# Patient Record
Sex: Male | Born: 1980 | Race: Black or African American | Hispanic: No | Marital: Single | Smoking: Never smoker
Health system: Southern US, Community
[De-identification: ages and names within clinical notes are randomized; demographics above are authoritative.]

## PROBLEM LIST (undated history)

## (undated) HISTORY — PX: TONSILECTOMY, ADENOIDECTOMY, BILATERAL MYRINGOTOMY AND TUBES: SHX2538

---
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
## 2001-10-30 ENCOUNTER — Emergency Department (HOSPITAL_COMMUNITY): Admission: EM | Admit: 2001-10-30 | Discharge: 2001-10-30 | Payer: Self-pay | Admitting: Emergency Medicine

## 2008-07-22 ENCOUNTER — Emergency Department (HOSPITAL_COMMUNITY): Admission: EM | Admit: 2008-07-22 | Discharge: 2008-07-22 | Payer: Self-pay | Admitting: Family Medicine

## 2010-06-14 ENCOUNTER — Encounter: Payer: Self-pay | Admitting: Cardiovascular Disease

## 2010-06-22 ENCOUNTER — Ambulatory Visit (INDEPENDENT_AMBULATORY_CARE_PROVIDER_SITE_OTHER): Payer: 59 | Admitting: Cardiovascular Disease

## 2010-06-22 ENCOUNTER — Encounter: Payer: Self-pay | Admitting: Cardiovascular Disease

## 2010-06-22 DIAGNOSIS — R079 Chest pain, unspecified: Secondary | ICD-10-CM

## 2010-06-22 NOTE — Assessment & Plan Note (Addendum)
His chest pains are somewhat atypical.  He does have a  Family history of CAD.  Will schedule him for a stress echo.  I've encouraged him to make sure his cholesterol levels are controlled.  I'll see him on an as needed basis if the stress echo is normal.  Otherwise, I'll see him soon if the stress echo is abnormal.  I've asked him to try Motrin for his pleuritic cp.  He had a prolonged upper respiratory infection and he may have some pleurisy.  I dont think this is pericardial chest pain.

## 2010-06-22 NOTE — Progress Notes (Signed)
Nicolas Arias Date of Birth  21-Apr-1980 [facility] / [facility] [address]     [address] [place], [place]  [address] [phone]  Fax  [phone]  History of Present Illness:  Healthy, young male with occasional episodes of cp.  Not assoc. With any activities.  Last 2-10 minutes.  Nothing makes better or worse.  + pleuretic component.  No leg swelling. No syncope.   Current Outpatient Prescriptions on File Prior to Visit  Medication Sig Dispense Refill  . amoxicillin-clavulanate (AUGMENTIN) 875-125 MG per tablet Take 1 tablet by mouth 2 (two) times daily.        . Benzonatate (TESSALON PERLES PO) Take by mouth 3 (three) times daily.          No Known Allergies  Past Medical History  Diagnosis Date  . Chest pain     Past Surgical History  Procedure Date  . Tonsilectomy, adenoidectomy, bilateral myringotomy and tubes     History  Smoking status  . Never Smoker   Smokeless tobacco  . Not on file    History  Alcohol Use No    Family History  Problem Relation Age of Onset  . Arrhythmia Father     Reviw of Systems:  Reviewed in the HPI.  All other systems are negative.  Physical Exam: BP 98/70  Pulse 80  Wt 150 lb (68.04 kg) The patient is alert and oriented x 3.  The mood and affect are normal.  The skin is warm and dry.  Color is normal.  The HEENT exam reveals that the sclera are nonicteric.  The mucous membranes are moist.  The carotids are 2+ without bruits.  There is no thyromegaly.  There is no JVD.  The lungs are clear.  The chest wall is non tender.  The heart exam reveals a regular rate with a normal S1 and S2.  There are no murmurs, gallops, or rubs.  The PMI is not displaced.   Abdominal exam reveals good bowel sounds.  There is no guarding or rebound.  There is no hepatosplenomegaly or tenderness.  There are no masses.  Exam of the legs reveal no clubbing, cyanosis, or edema.  The legs are without rashes.  The distal  pulses are intact.  Cranial nerves II - XII are intact.  Motor and sensory functions are intact.  The gait is normal.  ECG: 3 lead rhythm strip reveals NSR with  no ST or T wave changes.    Assessment / Plan:

## 2010-07-11 ENCOUNTER — Other Ambulatory Visit (HOSPITAL_COMMUNITY): Payer: 59 | Admitting: Radiology

## 2010-07-23 ENCOUNTER — Telehealth: Payer: Self-pay | Admitting: Cardiovascular Disease

## 2010-07-23 NOTE — Telephone Encounter (Signed)
Fax: [phone] Latest OV

## 2010-08-07 ENCOUNTER — Telehealth: Payer: Self-pay | Admitting: *Deleted

## 2010-08-07 NOTE — Telephone Encounter (Signed)
Cant make echo app at this time due to insurance and finances per pt.

## 2011-12-18 ENCOUNTER — Encounter: Payer: Self-pay | Admitting: Family Medicine

## 2011-12-18 ENCOUNTER — Ambulatory Visit (INDEPENDENT_AMBULATORY_CARE_PROVIDER_SITE_OTHER): Payer: BC Managed Care – PPO | Admitting: Family Medicine

## 2011-12-18 VITALS — BP 98/70 | HR 77 | Temp 98.3°F | Ht 71.0 in | Wt 144.0 lb

## 2011-12-18 DIAGNOSIS — Z7689 Persons encountering health services in other specified circumstances: Secondary | ICD-10-CM

## 2011-12-18 DIAGNOSIS — Z13220 Encounter for screening for lipoid disorders: Secondary | ICD-10-CM

## 2011-12-18 DIAGNOSIS — Z131 Encounter for screening for diabetes mellitus: Secondary | ICD-10-CM

## 2011-12-18 DIAGNOSIS — Z0289 Encounter for other administrative examinations: Secondary | ICD-10-CM

## 2011-12-18 LAB — LIPID PANEL
HDL: 47.4 mg/dL (ref >39.00)
LDL Cholesterol: 99 mg/dL (ref 0–99)
Total CHOL/HDL Ratio: 3
VLDL: 9.4 mg/dL (ref 0.0–40.0)

## 2011-12-18 LAB — HEMOGLOBIN A1C: Hgb A1c MFr Bld: 5.8 % (ref 4.6–6.5)

## 2011-12-18 NOTE — Patient Instructions (Addendum)
-  We have ordered labs or studies at this visit. It can take up to 1-2 weeks for results and processing. We will contact you with instructions IF your results are abnormal. Normal results will be released to your MYCHART. If you have not heard from us or can not find your results in MYCHART in 2 weeks please contact our office.  -PLEASE SIGN UP FOR MYCHART TODAY   We recommend the following healthy lifestyle measures: - eat a healthy diet consisting of lots of vegetables, fruits, beans, nuts, seeds, healthy meats such as white chicken and fish and whole grains.  - avoid fried foods, fast food, processed foods, sodas, red meet and other fattening foods.  - get a least 150 minutes of aerobic exercise per week.   Follow up in: 1 year  

## 2011-12-18 NOTE — Progress Notes (Signed)
Chief Complaint  Patient presents with  . Establish Care    HPI: Nicolas Arias is here to establish care. 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 No concerns today. No chronic health problems. No regular exercise.  Diet is good. Just recently had a new baby - children are 8, 4 and 4 weeks. Had some atypical CP early this year and was going to get an echo, but symptoms resolved and has had none since and did not get this exam.  Has the following concerns today: None  Other Providers: None  Flu vaccine: refused, had tetanus booster 1-2 years ago.  ROS: See pertinent positives and negatives per HPI. Denies CP, SOB, palpitations, bowel changes, fevers, weigh changes, urinary symptoms, vision problems.  Past Medical History  Diagnosis Date  . Chest pain     Family History  Problem Relation Age of Onset  . Arrhythmia Father   . Heart disease Father     pacemaker, MI  . Hypertension      parent     History   Social History  . Marital Status: Single    Spouse Name: N/A    Number of Children: N/A  . Years of Education: N/A   Social History Main Topics  . Smoking status: Never Smoker   . Smokeless tobacco: None  . Alcohol Use: No  . Drug Use: None  . Sexually Active: None   Other Topics Concern  . None   Social History Narrative  . None    No current outpatient prescriptions on file.  EXAM:  Filed Vitals:   12/18/11 0929  BP: 98/70  Pulse: 77  Temp: 98.3 F (36.8 C)    Body mass index is 20.08 kg/(m^2).  GENERAL: vitals reviewed and listed above, alert, oriented, appears well hydrated and in no acute distress  HEENT: atraumatic, conjunttiva clear, no obvious abnormalities on inspection of external nose and ears  NECK: no obvious masses on inspection  LUNGS: clear to auscultation bilaterally, no wheezes, rales or rhonchi, good air movement  CV: HRRR, no peripheral edema  MS: moves all extremities without noticeable  abnormality  PSYCH: pleasant and cooperative, no obvious depression or anxiety  ASSESSMENT AND PLAN:  Discussed the following assessment and plan:  1. Encounter to establish care with new doctor    2. Diabetes mellitus screening  Hemoglobin A1c  3. Screening for hypercholesterolemia  Lipid Panel   -did eat today - but wants to go ahead and do labs -discussed level A and B USPSTF recommendations -father with heart disease - he does not know age - discussed importance of healthy diet and regular exercise -We reviewed the PMH, PSH, FH, SH, Meds and Allergies. -We provided refills for any medications we will prescribe as needed. -We addressed current concerns per orders and patient instructions. -We have asked for records for pertinent exams, studies, vaccines and notes from previous providers. -We have advised patient to follow up per instructions below. -Influenza vaccine refused  -Patient advised to return or notify a doctor immediately if symptoms worsen or persist or new concerns arise.  Patient Instructions  -We have ordered labs or studies at this visit. It can take up to 1-2 weeks for results and processing. We will contact you with instructions IF your results are abnormal. Normal results will be released to your [facility]. If you have not heard from [place] or can not find your results in [facility] in 2 weeks please contact our office.  -PLEASE  SIGN UP FOR MYCHART TODAY   We recommend the following healthy lifestyle measures: - eat a healthy diet consisting of lots of vegetables, fruits, beans, nuts, seeds, healthy meats such as white chicken and fish and whole grains.  - avoid fried foods, fast food, processed foods, sodas, red meet and other fattening foods.  - get a least 150 minutes of aerobic exercise per week.   Follow up in: 1 year      Aneli Zara, Damita Lack.

## 2011-12-19 ENCOUNTER — Telehealth: Payer: Self-pay | Admitting: Family Medicine

## 2011-12-19 NOTE — Telephone Encounter (Signed)
Left a message for pt to return call 

## 2011-12-19 NOTE — Telephone Encounter (Signed)
Called and spoke with pt and pt is aware.  

## 2011-12-19 NOTE — Telephone Encounter (Signed)
Please let patient know, since not yet signed up for mychart, wanted to contact regarding lab results. Recommend signing up for mychart to see these results in about 10 days.  -cholesterol is good -diabetes screening lab is a little high (>5.6) indicating a risk for developing diabetes  The best treatment to hopefully reverse these findings and prevent adverse health outcomes is a healthy diet and regular exercise.  Schedule follow up in about 4-6 months if not already scheduled to do so.

## 2012-05-25 ENCOUNTER — Emergency Department (HOSPITAL_COMMUNITY)
Admission: EM | Admit: 2012-05-25 | Discharge: 2012-05-26 | Disposition: A | Discharge status: Home / Self Care | Payer: BC Managed Care – PPO | Attending: Emergency Medicine | Admitting: Emergency Medicine

## 2012-05-25 ENCOUNTER — Encounter (HOSPITAL_COMMUNITY): Payer: Self-pay | Admitting: *Deleted

## 2012-05-25 DIAGNOSIS — R059 Cough, unspecified: Secondary | ICD-10-CM | POA: Insufficient documentation

## 2012-05-25 DIAGNOSIS — IMO0001 Reserved for inherently not codable concepts without codable children: Secondary | ICD-10-CM | POA: Insufficient documentation

## 2012-05-25 DIAGNOSIS — Z9089 Acquired absence of other organs: Secondary | ICD-10-CM | POA: Insufficient documentation

## 2012-05-25 DIAGNOSIS — R079 Chest pain, unspecified: Secondary | ICD-10-CM | POA: Insufficient documentation

## 2012-05-25 DIAGNOSIS — R509 Fever, unspecified: Secondary | ICD-10-CM | POA: Insufficient documentation

## 2012-05-25 DIAGNOSIS — R1110 Vomiting, unspecified: Secondary | ICD-10-CM | POA: Insufficient documentation

## 2012-05-25 DIAGNOSIS — R05 Cough: Secondary | ICD-10-CM | POA: Insufficient documentation

## 2012-05-25 NOTE — ED Notes (Signed)
Pt states that he began having nausea/ vomiting, fever and generalized body aches, chest pain, cough and urinary pain that began early this morning; pt was seen at an Urgent Care on [address] around 3pm and was prescribed Levaquin; pt states that he does not know what the anbx are for or what he was diagnosed with. Pt states that he is concerned over the chest pain and cough and that he has a fever. Pt states that there was no blood work or Xrays done at the Urgent Care today.

## 2012-05-26 ENCOUNTER — Emergency Department (HOSPITAL_COMMUNITY): Payer: BC Managed Care – PPO

## 2012-05-26 LAB — CBC WITH DIFFERENTIAL/PLATELET
Basophils Absolute: 0 10*3/uL (ref 0.0–0.1)
Basophils Relative: 0 % (ref 0–1)
Eosinophils Absolute: 0 10*3/uL (ref 0.0–0.7)
MCH: 31.6 pg (ref 26.0–34.0)
MCHC: 33.3 g/dL (ref 30.0–36.0)
Neutro Abs: 3.7 10*3/uL (ref 1.7–7.7)
Neutrophils Relative %: 74 % (ref 43–77)
Platelets: 155 10*3/uL (ref 150–400)
RBC: 4.24 MIL/uL (ref 4.22–5.81)

## 2012-05-26 LAB — COMPREHENSIVE METABOLIC PANEL
ALT: 16 U/L (ref 0–53)
AST: 20 U/L (ref 0–37)
Albumin: 4 g/dL (ref 3.5–5.2)
Alkaline Phosphatase: 61 U/L (ref 39–117)
Chloride: 97 mEq/L (ref 96–112)
Potassium: 4 mEq/L (ref 3.5–5.1)
Sodium: 133 mEq/L — ABNORMAL LOW (ref 135–145)
Total Protein: 6.9 g/dL (ref 6.0–8.3)

## 2012-05-26 LAB — URINALYSIS, ROUTINE W REFLEX MICROSCOPIC
Bilirubin Urine: NEGATIVE
Hgb urine dipstick: NEGATIVE
Specific Gravity, Urine: 1.006 (ref 1.005–1.030)
pH: 6.5 (ref 5.0–8.0)

## 2012-05-26 NOTE — ED Notes (Signed)
Pt given 1 cup of water with no complaints. Pt is currently sleeping.

## 2012-05-26 NOTE — ED Provider Notes (Signed)
History     CSN: [id number] Arrival date & time 05/25/12  2242 First MD Initiated Contact with Patient 05/26/12 [phone]      Chief Complaint  Patient presents with  . Emesis  . Fever    HPI Pt started having body aches an chest pain associated with cough today.  He went to an urgent care but states he did not really get checked out.  He was given a prescription for Levaquin. His symptoms persisted throughout the day so he came to the ED.  He vomited once while waiting to be evaluated but otherwise has been doing better. He has noticed that it hurts when he urinates.  Past Medical History  Diagnosis Date  . Chest pain     Past Surgical History  Procedure Laterality Date  . Tonsilectomy, adenoidectomy, bilateral myringotomy and tubes      Family History  Problem Relation Age of Onset  . Arrhythmia Father   . Heart disease Father     pacemaker, MI  . Hypertension      parent     History  Substance Use Topics  . Smoking status: Never Smoker   . Smokeless tobacco: Not on file  . Alcohol Use: No      Review of Systems  All other systems reviewed and are negative.    Allergies  Review of patient's allergies indicates no known allergies.  Home Medications   Current Outpatient Rx  Name  Route  Sig  Dispense  Refill  . levofloxacin (LEVAQUIN) 500 MG tablet   Oral   Take 500 mg by mouth daily. 10 day course           BP 104/68  Pulse 100  Temp(Src) 100.1 F (37.8 C) (Oral)  Resp 20  Ht 5\' 11"  (1.803 m)  Wt 150 lb 6.4 oz (68.221 kg)  BMI 20.99 kg/m2  SpO2 96%  Physical Exam  Nursing note and vitals reviewed. Constitutional: He appears well-developed and well-nourished. No distress.  HENT:  Head: Normocephalic and atraumatic.  Right Ear: External ear normal.  Left Ear: External ear normal.  Eyes: Conjunctivae are normal. Right eye exhibits no discharge. Left eye exhibits no discharge. No scleral icterus.  Neck: Neck supple. No tracheal deviation  present. No thyromegaly present.  Cardiovascular: Normal rate, regular rhythm and intact distal pulses.   Pulmonary/Chest: Effort normal and breath sounds normal. No stridor. No respiratory distress. He has no wheezes. He has no rales.  Abdominal: Soft. Bowel sounds are normal. He exhibits no distension. There is no tenderness. There is no rebound and no guarding.  Musculoskeletal: He exhibits no edema and no tenderness.  Lymphadenopathy:    He has no cervical adenopathy.  Neurological: He is alert. He has normal strength. No sensory deficit. Cranial nerve deficit:  no gross defecits noted. He exhibits normal muscle tone. He displays no seizure activity. Coordination normal.  Skin: Skin is warm and dry. No rash noted.  Psychiatric: He has a normal mood and affect.    ED Course  Procedures (including critical care time) EKG Normal sinus rhythm rate 96 Rightward axis normal intervals Early repolarization otherwise normal ST T waves No prior EKG for comparison Labs Reviewed  CBC WITH DIFFERENTIAL - Abnormal; Notable for the following:    Lymphocytes Relative 7 (*)    Lymphs Abs 0.4 (*)    Monocytes Relative 18 (*)    All other components within normal limits  COMPREHENSIVE METABOLIC PANEL - Abnormal; Notable for the following:  Sodium 133 (*)    Glucose, Bld 108 (*)    GFR calc non Af Amer 79 (*)    All other components within normal limits  LIPASE, BLOOD  URINALYSIS, ROUTINE W REFLEX MICROSCOPIC  POCT I-STAT TROPONIN I   Dg Chest 1 View  05/26/2012  *RADIOLOGY REPORT*  Clinical Data: Fever, nausea and chest pain.  CHEST - 1 VIEW  Comparison: None.  Findings: The lungs are well-aerated and clear.  There is no evidence of focal opacification, pleural effusion or pneumothorax.  The cardiomediastinal silhouette is within normal limits.  No acute osseous abnormalities are seen.  IMPRESSION: No acute cardiopulmonary process seen.   Original Report Authenticated By: Tonia Ghent, M.D.       1. Febrile illness       MDM  Patient's exam is benign. His laboratory tests are unremarkable. Is possible the symptoms may be related to a viral infection. Patient will be discharged home. He should take Tylenol ibuprofen. He should followup with her primary Dr. if not getting better within the week.        Celene Kras, MD 05/26/12 [phone]

## 2012-06-15 ENCOUNTER — Telehealth: Payer: Self-pay | Admitting: Family Medicine

## 2012-06-15 NOTE — Telephone Encounter (Signed)
Ok with me if Dr. Caryl Never taking new patients.

## 2012-06-15 NOTE — Telephone Encounter (Signed)
Message copied by Terressa Koyanagi on Mon Jun 15, 2012  1:30 PM ------      Message from: Primus Bravo      Created: Mon Jun 15, 2012  1:16 PM      Regarding: Change PCP      Contact: [phone]       Pt would like to change his PCP from Dr. Selena Batten, to the care of Dr. Caryl Never. Please assist.  ------

## 2012-06-17 ENCOUNTER — Ambulatory Visit: Payer: BC Managed Care – PPO | Admitting: Family Medicine

## 2012-06-24 ENCOUNTER — Ambulatory Visit: Payer: BC Managed Care – PPO | Admitting: Family Medicine

## 2012-10-15 ENCOUNTER — Ambulatory Visit: Payer: BC Managed Care – PPO | Admitting: Family Medicine

## 2012-11-19 ENCOUNTER — Encounter: Payer: Self-pay | Admitting: Family Medicine

## 2012-11-19 ENCOUNTER — Ambulatory Visit (INDEPENDENT_AMBULATORY_CARE_PROVIDER_SITE_OTHER): Payer: BC Managed Care – PPO | Admitting: Family Medicine

## 2012-11-19 VITALS — BP 112/62 | HR 70 | Temp 97.8°F | Wt 156.0 lb

## 2012-11-19 DIAGNOSIS — Z0000 Encounter for general adult medical examination without abnormal findings: Secondary | ICD-10-CM

## 2012-11-19 LAB — BASIC METABOLIC PANEL
BUN: 12 mg/dL (ref 6–23)
CO2: 29 mEq/L (ref 19–32)
Chloride: 105 mEq/L (ref 96–112)
Glucose, Bld: 95 mg/dL (ref 70–99)
Potassium: 4.5 mEq/L (ref 3.5–5.1)

## 2012-11-19 LAB — HEPATIC FUNCTION PANEL
ALT: 13 U/L (ref 0–53)
Bilirubin, Direct: 0.1 mg/dL (ref 0.0–0.3)
Total Bilirubin: 1.2 mg/dL (ref 0.3–1.2)

## 2012-11-19 LAB — CBC WITH DIFFERENTIAL/PLATELET
Basophils Relative: 0.5 % (ref 0.0–3.0)
Eosinophils Absolute: 0.1 10*3/uL (ref 0.0–0.7)
Lymphocytes Relative: 43.4 % (ref 12.0–46.0)
MCHC: 34 g/dL (ref 30.0–36.0)
Monocytes Relative: 7.9 % (ref 3.0–12.0)
Neutrophils Relative %: 46.5 % (ref 43.0–77.0)
RBC: 4.41 Mil/uL (ref 4.22–5.81)
WBC: 4.8 10*3/uL (ref 4.5–10.5)

## 2012-11-19 LAB — LIPID PANEL
Cholesterol: 161 mg/dL (ref 0–200)
LDL Cholesterol: 92 mg/dL (ref 0–99)
Total CHOL/HDL Ratio: 3
VLDL: 9.2 mg/dL (ref 0.0–40.0)

## 2012-11-19 LAB — POCT URINALYSIS DIPSTICK
Glucose, UA: NEGATIVE
Ketones, UA: NEGATIVE
Protein, UA: NEGATIVE
Spec Grav, UA: 1.02

## 2012-11-19 NOTE — Progress Notes (Signed)
  Subjective:    Patient ID: Nicolas Arias, male    DOB: 1980-06-11, 32 y.o.   MRN: [id number]  HPI Patient seen for complete physical Generally very healthy. No chronic medical problems. Takes no medications. No known drug allergies. Previous surgeries for tonsillectomy and myringotomy tubes.  Patient nonsmoker. He is married has 3 children. No regular alcohol use. No consistent exercise. No specific complaints today. Had some atypical chest pains over year ago was felt related to GERD. Had none since then.  Past Medical History  Diagnosis Date  . Chest pain    Past Surgical History  Procedure Laterality Date  . Tonsilectomy, adenoidectomy, bilateral myringotomy and tubes      reports that he has never smoked. He does not have any smokeless tobacco history on file. He reports that he does not drink alcohol or use illicit drugs. family history includes Arrhythmia in his father; Heart disease in his father; Hypertension in an other family member. No Known Allergies    Review of Systems  Constitutional: Negative for fever, activity change, appetite change and fatigue.  HENT: Negative for congestion, ear pain and trouble swallowing.   Eyes: Negative for pain and visual disturbance.  Respiratory: Negative for cough, shortness of breath and wheezing.   Cardiovascular: Negative for chest pain and palpitations.  Gastrointestinal: Negative for nausea, vomiting, abdominal pain, diarrhea, constipation, blood in stool, abdominal distention and rectal pain.  Endocrine: Negative for polydipsia and polyuria.  Genitourinary: Negative for dysuria, hematuria and testicular pain.  Musculoskeletal: Negative for arthralgias and joint swelling.  Skin: Negative for rash.  Neurological: Negative for dizziness, syncope and headaches.  Hematological: Negative for adenopathy.  Psychiatric/Behavioral: Negative for confusion and dysphoric mood.       Objective:   Physical Exam  Constitutional: He  is oriented to person, place, and time. He appears well-developed and well-nourished. No distress.  HENT:  Head: Normocephalic and atraumatic.  Right Ear: External ear normal.  Left Ear: External ear normal.  Mouth/Throat: Oropharynx is clear and moist.  Eyes: Conjunctivae and EOM are normal. Pupils are equal, round, and reactive to light.  Neck: Normal range of motion. Neck supple. No thyromegaly present.  Cardiovascular: Normal rate, regular rhythm and normal heart sounds.   No murmur heard. Pulmonary/Chest: No respiratory distress. He has no wheezes. He has no rales.  Abdominal: Soft. Bowel sounds are normal. He exhibits no distension and no mass. There is no tenderness. There is no rebound and no guarding.  Musculoskeletal: He exhibits no edema.  Lymphadenopathy:    He has no cervical adenopathy.  Neurological: He is alert and oriented to person, place, and time. He displays normal reflexes. No cranial nerve deficit.  Skin: No rash noted.  Psychiatric: He has a normal mood and affect.          Assessment & Plan:  Complete physical. Obtain screening lab work. Patient already had flu vaccine through work. Tetanus up-to-date. Discussed importance of regular physical exercise.

## 2013-12-17 ENCOUNTER — Encounter: Payer: BC Managed Care – PPO | Admitting: Urology

## 2014-04-13 ENCOUNTER — Other Ambulatory Visit (INDEPENDENT_AMBULATORY_CARE_PROVIDER_SITE_OTHER): Payer: BLUE CROSS/BLUE SHIELD

## 2014-04-13 DIAGNOSIS — Z0000 Encounter for general adult medical examination without abnormal findings: Secondary | ICD-10-CM

## 2014-04-13 LAB — LIPID PANEL
CHOLESTEROL: 147 mg/dL (ref 0–200)
HDL: 52.2 mg/dL (ref >39.00)
LDL Cholesterol: 86 mg/dL (ref 0–99)
NonHDL: 94.8
TRIGLYCERIDES: 42 mg/dL (ref 0.0–149.0)
Total CHOL/HDL Ratio: 3
VLDL: 8.4 mg/dL (ref 0.0–40.0)

## 2014-04-13 LAB — CBC WITH DIFFERENTIAL/PLATELET
BASOS ABS: 0 10*3/uL (ref 0.0–0.1)
BASOS PCT: 0.6 % (ref 0.0–3.0)
EOS PCT: 0.8 % (ref 0.0–5.0)
Eosinophils Absolute: 0 10*3/uL (ref 0.0–0.7)
HCT: 40.7 % (ref 39.0–52.0)
HEMOGLOBIN: 13.7 g/dL (ref 13.0–17.0)
LYMPHS PCT: 48.1 % — AB (ref 12.0–46.0)
Lymphs Abs: 2.3 10*3/uL (ref 0.7–4.0)
MCHC: 33.6 g/dL (ref 30.0–36.0)
MCV: 93.1 fl (ref 78.0–100.0)
MONOS PCT: 7.4 % (ref 3.0–12.0)
Monocytes Absolute: 0.4 10*3/uL (ref 0.1–1.0)
NEUTROS ABS: 2 10*3/uL (ref 1.4–7.7)
Neutrophils Relative %: 43.1 % (ref 43.0–77.0)
Platelets: 166 10*3/uL (ref 150.0–400.0)
RBC: 4.37 Mil/uL (ref 4.22–5.81)
RDW: 12.6 % (ref 11.5–15.5)
WBC: 4.7 10*3/uL (ref 4.0–10.5)

## 2014-04-13 LAB — HEPATIC FUNCTION PANEL
ALBUMIN: 4.4 g/dL (ref 3.5–5.2)
ALK PHOS: 56 U/L (ref 39–117)
ALT: 9 U/L (ref 0–53)
AST: 13 U/L (ref 0–37)
Bilirubin, Direct: 0.2 mg/dL (ref 0.0–0.3)
TOTAL PROTEIN: 6.8 g/dL (ref 6.0–8.3)
Total Bilirubin: 1 mg/dL (ref 0.2–1.2)

## 2014-04-13 LAB — BASIC METABOLIC PANEL
BUN: 11 mg/dL (ref 6–23)
CALCIUM: 9.3 mg/dL (ref 8.4–10.5)
CO2: 29 mEq/L (ref 19–32)
Chloride: 105 mEq/L (ref 96–112)
Creatinine, Ser: 1.19 mg/dL (ref 0.40–1.50)
GFR: 90.69 mL/min (ref >60.00)
GLUCOSE: 92 mg/dL (ref 70–99)
POTASSIUM: 4 meq/L (ref 3.5–5.1)
Sodium: 138 mEq/L (ref 135–145)

## 2014-04-13 LAB — TSH: TSH: 1.05 u[IU]/mL (ref 0.35–4.50)

## 2014-04-19 ENCOUNTER — Ambulatory Visit (INDEPENDENT_AMBULATORY_CARE_PROVIDER_SITE_OTHER): Payer: BLUE CROSS/BLUE SHIELD | Admitting: Family Medicine

## 2014-04-19 ENCOUNTER — Encounter: Payer: Self-pay | Admitting: Family Medicine

## 2014-04-19 VITALS — BP 120/80 | HR 78 | Temp 98.1°F | Ht 71.0 in | Wt 153.0 lb

## 2014-04-19 DIAGNOSIS — Z0000 Encounter for general adult medical examination without abnormal findings: Secondary | ICD-10-CM

## 2014-04-19 NOTE — Progress Notes (Signed)
   Subjective:    Patient ID: Nicolas Arias, male    DOB: 01/09/1981, 34 y.o.   MRN: [id number]  HPI Patient seen for complete physical. He has no chronic medical problems. He unfortunately was just laid off from work and will finish his current position in one month. 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 No history of smoking. Tetanus up-to-date. He declined flu vaccine this year. Somewhat inconsistent exercise.  Past Medical History  Diagnosis Date  . Chest pain    Past Surgical History  Procedure Laterality Date  . Tonsilectomy, adenoidectomy, bilateral myringotomy and tubes      reports that he has never smoked. He does not have any smokeless tobacco history on file. He reports that he does not drink alcohol or use illicit drugs. family history includes Arrhythmia in his father; Heart disease in his father; Hypertension in an other family member. No Known Allergies'    Review of Systems  Constitutional: Negative for fever, activity change, appetite change and fatigue.  HENT: Negative for congestion, ear pain and trouble swallowing.   Eyes: Negative for pain and visual disturbance.  Respiratory: Negative for cough, shortness of breath and wheezing.   Cardiovascular: Negative for chest pain and palpitations.  Gastrointestinal: Negative for nausea, vomiting, abdominal pain, diarrhea, constipation, blood in stool, abdominal distention and rectal pain.  Genitourinary: Negative for dysuria, hematuria and testicular pain.  Musculoskeletal: Negative for joint swelling and arthralgias.  Skin: Negative for rash.  Neurological: Negative for dizziness, syncope and headaches.  Hematological: Negative for adenopathy.  Psychiatric/Behavioral: Negative for confusion and dysphoric mood.       Objective:   Physical Exam  Constitutional: He is oriented to person, place, and time. He appears well-developed and well-nourished. No distress.  HENT:  Head:  Normocephalic and atraumatic.  Right Ear: External ear normal.  Left Ear: External ear normal.  Mouth/Throat: Oropharynx is clear and moist.  Eyes: Conjunctivae and EOM are normal. Pupils are equal, round, and reactive to light.  Neck: Normal range of motion. Neck supple. No thyromegaly present.  Solitary approximate 1/2 cm mobile rubbery consistency oval-shaped lymph node right anterior cervical triangle. No other adenopathy noted  Cardiovascular: Normal rate, regular rhythm and normal heart sounds.   No murmur heard. Pulmonary/Chest: No respiratory distress. He has no wheezes. He has no rales.  Abdominal: Soft. Bowel sounds are normal. He exhibits no distension and no mass. There is no tenderness. There is no rebound and no guarding.  Musculoskeletal: He exhibits no edema.  Neurological: He is alert and oriented to person, place, and time. He displays normal reflexes. No cranial nerve deficit.  Skin: No rash noted.  Psychiatric: He has a normal mood and affect.          Assessment & Plan:  Complete physical. Labs reviewed. No major concerns. Establish more consistent exercise habits. Tetanus up-to-date.

## 2014-04-19 NOTE — Progress Notes (Signed)
Pre visit review using our clinic review tool, if applicable. No additional management support is needed unless otherwise documented below in the visit note. 

## 2015-02-09 ENCOUNTER — Encounter (HOSPITAL_COMMUNITY): Payer: Self-pay | Admitting: Emergency Medicine

## 2015-02-09 ENCOUNTER — Emergency Department (HOSPITAL_COMMUNITY)
Admission: EM | Admit: 2015-02-09 | Discharge: 2015-02-09 | Disposition: A | Discharge status: Home / Self Care | Payer: Medicaid Other | Source: Home / Self Care

## 2015-02-09 DIAGNOSIS — R079 Chest pain, unspecified: Secondary | ICD-10-CM | POA: Diagnosis not present

## 2015-02-09 NOTE — ED Notes (Signed)
[facility] a [facility] chest pressure x 2 weeks. The patient denied any triggers for the chest pain. He described the pain as a pressure. The patient denied any cardiac issues.

## 2015-02-09 NOTE — Discharge Instructions (Signed)
Aspirin and Your Heart  Aspirin is a medicine that affects the way blood clots. Aspirin can be used to help reduce the risk of blood clots, heart attacks, and other heart-related problems.  SHOULD I TAKE ASPIRIN? Your health care provider will help you determine whether it is safe and beneficial for you to take aspirin daily. Taking aspirin daily may be beneficial if you:  Have had a heart attack or chest pain.  Have undergone open heart surgery such as coronary artery bypass surgery (CABG).  Have had coronary angioplasty.  Have experienced a stroke or transient ischemic attack (TIA).  Have peripheral vascular disease (PVD).  Have chronic heart rhythm problems such as atrial fibrillation. ARE THERE ANY RISKS OF TAKING ASPIRIN DAILY? Daily use of aspirin can increase your risk of side effects. Some of these include:  Bleeding. Bleeding problems can be minor or serious. An example of a minor problem is a cut that does not stop bleeding. An example of a more serious problem is stomach bleeding or bleeding into the brain. Your risk of bleeding is increased if you are also taking non-steroidal anti-inflammatory medicine (NSAIDs).  Increased bruising.  Upset stomach.  An allergic reaction. People who have nasal polyps have an increased risk of developing an aspirin allergy. WHAT ARE SOME GUIDELINES I SHOULD FOLLOW WHEN TAKING ASPIRIN?   Take aspirin only as directed by your health care provider. Make sure you understand how much you should take and what form you should take. The two forms of aspirin are:  Non-enteric-coated. This type of aspirin does not have a coating and is absorbed quickly. Non-enteric-coated aspirin is usually recommended for people with chest pain. This type of aspirin also comes in a chewable form.  Enteric-coated. This type of aspirin has a special coating that releases the medicine very slowly. Enteric-coated aspirin causes less stomach upset than non-enteric-coated  aspirin. This type of aspirin should not be chewed or crushed.  Drink alcohol in moderation. Drinking alcohol increases your risk of bleeding. WHEN SHOULD I SEEK MEDICAL CARE?   You have unusual bleeding or bruising.  You have stomach pain.  You have an allergic reaction. Symptoms of an allergic reaction include:  Hives.  Itchy skin.  Swelling of the lips, tongue, or face.  You have ringing in your ears. WHEN SHOULD I SEEK IMMEDIATE MEDICAL CARE?   Your bowel movements are bloody, dark red, or black in color.  You vomit or cough up blood.  You have blood in your urine.  You cough, wheeze, or feel short of breath. If you have any of the following symptoms, this is an emergency. Do not wait to see if the pain will go away. Get medical help at once. Call your local emergency services (911 in the [place] Do not drive yourself to the hospital.  You have severe chest pain, especially if the pain is crushing or pressure-like and spreads to the arms, back, neck, or jaw.  You have stroke-like symptoms, such as:   Loss of vision.   Difficulty talking.   Numbness or weakness on one side of your body.   Numbness or weakness in your arm or leg.   Not thinking clearly or feeling confused.    This information is not intended to replace advice given to you by your health care provider. Make sure you discuss any questions you have with your health care provider.   Document Released: 01/11/2008 Document Revised: 02/18/2014 Document Reviewed: 05/05/2013 Elsevier Interactive Patient Education 2016 [employer]   Inc.  

## 2015-02-10 NOTE — ED Provider Notes (Signed)
CSN: [id number]     Arrival date & time 02/09/15  1820 History   None    Chief Complaint  Patient presents with  . Chest Pain   (Consider location/radiation/quality/duration/timing/severity/associated sxs/prior Treatment) HPI History obtained from patient:   LOCATION:chest SEVERITY:1 DURATION:several weeks CONTEXT:episodic pain no cause, comes and goes, not related to activity or work QUALITY: MODIFYING FACTORS:none ASSOCIATED SYMPTOMS:none TIMING:episodic OCCUPATION:investor  Past Medical History  Diagnosis Date  . Chest pain    Past Surgical History  Procedure Laterality Date  . Tonsilectomy, adenoidectomy, bilateral myringotomy and tubes     Family History  Problem Relation Age of Onset  . Arrhythmia Father   . Heart disease Father     pacemaker, MI  . Hypertension      parent    Social History  Substance Use Topics  . Smoking status: Never Smoker   . Smokeless tobacco: None  . Alcohol Use: No    Review of Systems ROS +'ve chest pain  Denies: HEADACHE, NAUSEA, ABDOMINAL PAIN,  CONGESTION, DYSURIA, SHORTNESS OF BREATH  Allergies  Review of patient's allergies indicates no known allergies.  Home Medications   Prior to Admission medications   Not on File   Meds Ordered and Administered this Visit  Medications - No data to display  BP 127/80 mmHg  Pulse 77  Temp(Src) 98.2 F (36.8 C) (Oral)  Resp 18  SpO2 99% No data found.   Physical Exam  Constitutional: He is oriented to person, place, and time. He appears well-developed and well-nourished.  HENT:  Head: Normocephalic and atraumatic.  Eyes: Conjunctivae are normal.  Neck: Normal range of motion. Neck supple.  Cardiovascular: Normal rate, regular rhythm and normal heart sounds.   Pulmonary/Chest: Effort normal and breath sounds normal. He exhibits no tenderness.  Abdominal: Soft. Bowel sounds are normal.  Musculoskeletal: Normal range of motion.  Neurological: He is alert and oriented  to person, place, and time.  Skin: Skin is warm and dry.  Psychiatric: He has a normal mood and affect. His behavior is normal. Judgment and thought content normal.  Nursing note and vitals reviewed.   ED Course  Procedures (including critical care time)  Labs Review Labs Reviewed - No data to display  Imaging Review No results found.   Visual Acuity Review  Right Eye Distance:   Left Eye Distance:   Bilateral Distance:    Right Eye Near:   Left Eye Near:    Bilateral Near:         MDM   1. Chest pain, unspecified chest pain type    Chest pain is non specific and ECG done in UC today is NSR with normal intervals, axis and no signs of ischemia. Pt is advised to seek referral from PCP [facility] as he may require stress test, echo etc. No indications at this time that emergent transfer to ER is needed. Aspirin is an option until seen by PCP. No history of cardiac disease  Similar symptoms about 5 years ago. Advised to go to ER or call 911 if new or different chest pain.    Tharon AquasFrank C Patrick, [place] 02/10/15 [phone]

## 2015-02-16 ENCOUNTER — Ambulatory Visit (INDEPENDENT_AMBULATORY_CARE_PROVIDER_SITE_OTHER): Payer: Medicaid Other | Admitting: Family Medicine

## 2015-02-16 ENCOUNTER — Encounter: Payer: Self-pay | Admitting: Family Medicine

## 2015-02-16 VITALS — BP 100/74 | HR 83 | Temp 98.2°F | Ht 71.0 in | Wt 160.0 lb

## 2015-02-16 DIAGNOSIS — R079 Chest pain, unspecified: Secondary | ICD-10-CM | POA: Diagnosis not present

## 2015-02-16 NOTE — Progress Notes (Signed)
Pre visit review using our clinic review tool, if applicable. No additional management support is needed unless otherwise documented below in the visit note. 

## 2015-02-16 NOTE — Patient Instructions (Signed)
Nonspecific Chest Pain  °Chest pain can be caused by many different conditions. There is always a chance that your pain could be related to something serious, such as a heart attack or a blood clot in your lungs. Chest pain can also be caused by conditions that are not life-threatening. If you have chest pain, it is very important to follow up with your health care provider. °CAUSES  °Chest pain can be caused by: °· Heartburn. °· Pneumonia or bronchitis. °· Anxiety or stress. °· Inflammation around your heart (pericarditis) or lung (pleuritis or pleurisy). °· A blood clot in your lung. °· A collapsed lung (pneumothorax). It can develop suddenly on its own (spontaneous pneumothorax) or from trauma to the chest. °· Shingles infection (varicella-zoster virus). °· Heart attack. °· Damage to the bones, muscles, and cartilage that make up your chest wall. This can include: °¨ Bruised bones due to injury. °¨ Strained muscles or cartilage due to frequent or repeated coughing or overwork. °¨ Fracture to one or more ribs. °¨ Sore cartilage due to inflammation (costochondritis). °RISK FACTORS  °Risk factors for chest pain may include: °· Activities that increase your risk for trauma or injury to your chest. °· Respiratory infections or conditions that cause frequent coughing. °· Medical conditions or overeating that can cause heartburn. °· Heart disease or family history of heart disease. °· Conditions or health behaviors that increase your risk of developing a blood clot. °· Having had chicken pox (varicella zoster). °SIGNS AND SYMPTOMS °Chest pain can feel like: °· Burning or tingling on the surface of your chest or deep in your chest. °· Crushing, pressure, aching, or squeezing pain. °· Dull or sharp pain that is worse when you move, cough, or take a deep breath. °· Pain that is also felt in your back, neck, shoulder, or arm, or pain that spreads to any of these areas. °Your chest pain may come and go, or it may stay  constant. °DIAGNOSIS °Lab tests or other studies may be needed to find the cause of your pain. Your health care provider may have you take a test called an ambulatory ECG (electrocardiogram). An ECG records your heartbeat patterns at the time the test is performed. You may also have other tests, such as: °· Transthoracic echocardiogram (TTE). During echocardiography, sound waves are used to create a picture of all of the heart structures and to look at how blood flows through your heart. °· Transesophageal echocardiogram (TEE). This is a more advanced imaging test that obtains images from inside your body. It allows your health care provider to see your heart in finer detail. °· Cardiac monitoring. This allows your health care provider to monitor your heart rate and rhythm in real time. °· Holter monitor. This is a portable device that records your heartbeat and can help to diagnose abnormal heartbeats. It allows your health care provider to track your heart activity for several days, if needed. °· Stress tests. These can be done through exercise or by taking medicine that makes your heart beat more quickly. °· Blood tests. °· Imaging tests. °TREATMENT  °Your treatment depends on what is causing your chest pain. Treatment may include: °· Medicines. These may include: °¨ Acid blockers for heartburn. °¨ Anti-inflammatory medicine. °¨ Pain medicine for inflammatory conditions. °¨ Antibiotic medicine, if an infection is present. °¨ Medicines to dissolve blood clots. °¨ Medicines to treat coronary artery disease. °· Supportive care for conditions that do not require medicines. This may include: °¨ Resting. °¨ Applying heat   or cold packs to injured areas. °¨ Limiting activities until pain decreases. °HOME CARE INSTRUCTIONS °· If you were prescribed an antibiotic medicine, finish it all even if you start to feel better. °· Avoid any activities that bring on chest pain. °· Do not use any tobacco products, including  cigarettes, chewing tobacco, or electronic cigarettes. If you need help quitting, ask your health care provider. °· Do not drink alcohol. °· Take medicines only as directed by your health care provider. °· Keep all follow-up visits as directed by your health care provider. This is important. This includes any further testing if your chest pain does not go away. °· If heartburn is the cause for your chest pain, you may be told to keep your head raised (elevated) while sleeping. This reduces the chance that acid will go from your stomach into your esophagus. °· Make lifestyle changes as directed by your health care provider. These may include: °¨ Getting regular exercise. Ask your health care provider to suggest some activities that are safe for you. °¨ Eating a heart-healthy diet. A registered dietitian can help you to learn healthy eating options. °¨ Maintaining a healthy weight. °¨ Managing diabetes, if necessary. °¨ Reducing stress. °SEEK MEDICAL CARE IF: °· Your chest pain does not go away after treatment. °· You have a rash with blisters on your chest. °· You have a fever. °SEEK IMMEDIATE MEDICAL CARE IF:  °· Your chest pain is worse. °· You have an increasing cough, or you cough up blood. °· You have severe abdominal pain. °· You have severe weakness. °· You faint. °· You have chills. °· You have sudden, unexplained chest discomfort. °· You have sudden, unexplained discomfort in your arms, back, neck, or jaw. °· You have shortness of breath at any time. °· You suddenly start to sweat, or your skin gets clammy. °· You feel nauseous or you vomit. °· You suddenly feel light-headed or dizzy. °· Your heart begins to beat quickly, or it feels like it is skipping beats. °These symptoms may represent a serious problem that is an emergency. Do not wait to see if the symptoms will go away. Get medical help right away. Call your local emergency services (911 in the [place] Do not drive yourself to the hospital. °  °This  information is not intended to replace advice given to you by your health care provider. Make sure you discuss any questions you have with your health care provider. °  °Document Released: 11/07/2004 Document Revised: 02/18/2014 Document Reviewed: 09/03/2013 °Elsevier Interactive Patient Education ©2016 [employer] ° °

## 2015-02-16 NOTE — Progress Notes (Signed)
   Subjective:    Patient ID: Nicolas Arias, male    DOB: 05/12/1980, 35 y.o.   MRN: [id number]  HPI Urgent care follow-up for chest pain Patient has had he states several years of intermittent chest pain but particularly over the past few months. Episodes usually occur at rest. He describes substernal chest pressure which sometimes can last for hours. Does sometimes have associated shortness of breath and fatigue.  No radiation into the left arm or left neck. No other clear provoking factors. No recent GERD symptoms. Does not exercise regularly  Patient went to urgent care and EKG unremarkable. No further testing ordered. Risk factors include family history with his father reportedly having CAD in his 5530s and also grandfather with CAD at unknown age. Patient is nonsmoker. His lipids have been fairly favorable. No hypertension or diabetes history. Does not exercise regularly.  Past Medical History  Diagnosis Date  . Chest pain    Past Surgical History  Procedure Laterality Date  . Tonsilectomy, adenoidectomy, bilateral myringotomy and tubes      reports that he has never smoked. He does not have any smokeless tobacco history on file. He reports that he does not drink alcohol or use illicit drugs. family history includes Arrhythmia in his father; Heart disease in his father. No Known Allergies    Review of Systems  Constitutional: Negative for fatigue.  Eyes: Negative for visual disturbance.  Respiratory: Positive for chest tightness. Negative for cough, shortness of breath and wheezing.   Cardiovascular: Positive for chest pain. Negative for palpitations and leg swelling.  Gastrointestinal: Negative for abdominal pain.  Endocrine: Negative for polydipsia and polyuria.  Neurological: Negative for dizziness, syncope, weakness, light-headedness and headaches.       Objective:   Physical Exam  Constitutional: He is oriented to person, place, and time. He appears  well-developed and well-nourished.  HENT:  Right Ear: External ear normal.  Left Ear: External ear normal.  Mouth/Throat: Oropharynx is clear and moist.  Eyes: Pupils are equal, round, and reactive to light.  Neck: Neck supple. No thyromegaly present.  Cardiovascular: Normal rate and regular rhythm.  Exam reveals no gallop.   No murmur heard. Pulmonary/Chest: Effort normal and breath sounds normal. No respiratory distress. He has no wheezes. He has no rales.  Musculoskeletal: He exhibits no edema.  Neurological: He is alert and oriented to person, place, and time.          Assessment & Plan:  Somewhat chronic history of intermittent chest pain at rest. Recent EKG no acute changes.  His only real risk factor is positive family histor Will set up further evaluation with exercise tolerance test and patient agrees with this plan.

## 2015-03-01 ENCOUNTER — Telehealth (HOSPITAL_COMMUNITY): Payer: Self-pay

## 2015-03-01 NOTE — Telephone Encounter (Signed)
Encounter complete. 

## 2015-03-02 ENCOUNTER — Ambulatory Visit (HOSPITAL_COMMUNITY)
Admission: RE | Admit: 2015-03-02 | Discharge: 2015-03-02 | Disposition: A | Discharge status: Home / Self Care | Payer: Medicaid Other | Source: Ambulatory Visit | Attending: Cardiovascular Disease | Admitting: Cardiovascular Disease

## 2015-03-02 DIAGNOSIS — R079 Chest pain, unspecified: Secondary | ICD-10-CM | POA: Insufficient documentation

## 2015-03-03 LAB — EXERCISE TOLERANCE TEST
CHL CUP STRESS STAGE 1 GRADE: 0 %
CHL CUP STRESS STAGE 1 HR: 96 {beats}/min
CHL CUP STRESS STAGE 1 SPEED: 0 mph
CHL CUP STRESS STAGE 2 GRADE: 0 %
CHL CUP STRESS STAGE 2 HR: 99 {beats}/min
CHL CUP STRESS STAGE 2 SBP: 106 mmHg
CHL CUP STRESS STAGE 3 HR: 98 {beats}/min
CHL CUP STRESS STAGE 4 DBP: 70 mmHg
CHL CUP STRESS STAGE 4 SPEED: 1.7 mph
CHL CUP STRESS STAGE 5 HR: 136 {beats}/min
CHL CUP STRESS STAGE 5 SPEED: 2.5 mph
CHL CUP STRESS STAGE 6 GRADE: 14 %
CHL CUP STRESS STAGE 6 SPEED: 3.4 mph
CHL CUP STRESS STAGE 7 GRADE: 16 %
CHL CUP STRESS STAGE 7 HR: 190 {beats}/min
CHL CUP STRESS STAGE 8 DBP: 79 mmHg
CHL CUP STRESS STAGE 8 GRADE: 0 %
CHL CUP STRESS STAGE 8 HR: 169 {beats}/min
CHL CUP STRESS STAGE 8 SBP: 126 mmHg
CHL CUP STRESS STAGE 9 DBP: 66 mmHg
CHL CUP STRESS STAGE 9 GRADE: 0 %
CSEPEW: 13.4 METS
CSEPPBP: 152 mmHg
CSEPPMHR: 101 %
Peak HR: 190 {beats}/min
Stage 2 DBP: 78 mmHg
Stage 2 Speed: 0.6 mph
Stage 3 Grade: 0.1 %
Stage 3 Speed: 1 mph
Stage 4 Grade: 10 %
Stage 4 HR: 120 {beats}/min
Stage 4 SBP: 116 mmHg
Stage 5 DBP: 69 mmHg
Stage 5 Grade: 12 %
Stage 5 SBP: 131 mmHg
Stage 6 DBP: 94 mmHg
Stage 6 HR: 155 {beats}/min
Stage 6 SBP: 131 mmHg
Stage 7 DBP: 80 mmHg
Stage 7 SBP: 152 mmHg
Stage 7 Speed: 4.2 mph
Stage 8 Speed: 0 mph
Stage 9 HR: 116 {beats}/min
Stage 9 SBP: 122 mmHg
Stage 9 Speed: 0 mph

## 2015-05-09 ENCOUNTER — Encounter (HOSPITAL_COMMUNITY): Payer: Self-pay | Admitting: Emergency Medicine

## 2015-05-09 ENCOUNTER — Emergency Department (HOSPITAL_COMMUNITY)
Admission: EM | Admit: 2015-05-09 | Discharge: 2015-05-09 | Disposition: A | Discharge status: Home / Self Care | Payer: Medicaid Other | Source: Home / Self Care | Attending: Family Medicine | Admitting: Family Medicine

## 2015-05-09 DIAGNOSIS — G44209 Tension-type headache, unspecified, not intractable: Secondary | ICD-10-CM

## 2015-05-09 DIAGNOSIS — R631 Polydipsia: Secondary | ICD-10-CM

## 2015-05-09 DIAGNOSIS — R3589 Other polyuria: Secondary | ICD-10-CM

## 2015-05-09 DIAGNOSIS — R358 Other polyuria: Secondary | ICD-10-CM

## 2015-05-09 LAB — POCT URINALYSIS DIP (DEVICE)
Bilirubin Urine: NEGATIVE
GLUCOSE, UA: NEGATIVE mg/dL
Hgb urine dipstick: NEGATIVE
KETONES UR: NEGATIVE mg/dL
Leukocytes, UA: NEGATIVE
Nitrite: NEGATIVE
PH: 8.5 — AB (ref 5.0–8.0)
PROTEIN: NEGATIVE mg/dL
Specific Gravity, Urine: 1.015 (ref 1.005–1.030)
UROBILINOGEN UA: 1 mg/dL (ref 0.0–1.0)

## 2015-05-09 LAB — GLUCOSE, CAPILLARY: GLUCOSE-CAPILLARY: 83 mg/dL (ref 65–99)

## 2015-05-09 NOTE — ED Notes (Signed)
Complains of headache, body aches, throat pain, started sunday

## 2015-05-09 NOTE — ED Provider Notes (Addendum)
CSN: [id number]     Arrival date & time 05/09/15  1356 History   First MD Initiated Contact with Patient 05/09/15 1600     No chief complaint on file.  (Consider location/radiation/quality/duration/timing/severity/associated sxs/prior Treatment) The history is provided by the patient. No language interpreter was used.   Patient with complaint of frontal headache, body aches, and sore throat which began on Sunday, March 26th in the morning. Sore throat and body aches worsened in the afternoon, was in bed for Sun and Mon due to this.  Woke up this morning feeling better, went to prayer group at church at 5am feeling well, but by 10am was feeling ill again.  Son (age 34) with cough and respiratory illness recently, shares a bed with his son.  Patient denies fevers/chills; has had some watery diarrhea and decreased appetite.  Reports polyuria and polydipsia in the past few days.  Denies dysuria.   Family Hx; no family hx of DM.   PMHx patient without history of DM.   Social Hx; Nonsmoker. Did not receive the flu shot this season.  Past Medical History  Diagnosis Date  . Chest pain    Past Surgical History  Procedure Laterality Date  . Tonsilectomy, adenoidectomy, bilateral myringotomy and tubes     Family History  Problem Relation Age of Onset  . Arrhythmia Father   . Heart disease Father     pacemaker, MI  . Hypertension      parent    Social History  Substance Use Topics  . Smoking status: Never Smoker   . Smokeless tobacco: Not on file  . Alcohol Use: No    Review of Systems  Constitutional: Negative for fever, chills, diaphoresis and fatigue.  HENT: Positive for congestion and sore throat.   Respiratory: Negative for cough.   Gastrointestinal: Positive for diarrhea. Negative for nausea, vomiting and abdominal pain.  Endocrine: Positive for polydipsia and polyuria.  Genitourinary: Negative for dysuria.  Neurological: Positive for headaches.    Allergies  Review of  patient's allergies indicates no known allergies.  Home Medications   Prior to Admission medications   Not on File   Meds Ordered and Administered this Visit  Medications - No data to display  BP 122/77 mmHg  Pulse 94  Temp(Src) 98.7 F (37.1 C) (Oral)  Resp 16  SpO2 100% No data found.   Physical Exam  Constitutional: He appears well-developed and well-nourished. No distress.  HENT:  Head: Normocephalic and atraumatic.  Right Ear: External ear normal.  Left Ear: External ear normal.  Nose: Nose normal.  Mouth/Throat: Oropharynx is clear and moist. No oropharyngeal exudate.  Eyes: Conjunctivae and EOM are normal. Pupils are equal, round, and reactive to light. Right eye exhibits no discharge. Left eye exhibits no discharge.  Neck: Normal range of motion. Neck supple. No thyromegaly present.  Cardiovascular: Normal rate, regular rhythm and normal heart sounds.   No murmur heard. Pulmonary/Chest: Effort normal and breath sounds normal. No respiratory distress. He has no wheezes. He has no rales. He exhibits no tenderness.  Abdominal: Soft. Bowel sounds are normal. He exhibits no distension and no mass. There is no tenderness. There is no rebound and no guarding.  Lymphadenopathy:    He has no cervical adenopathy.  Skin: He is not diaphoretic.    ED Course  Procedures (including critical care time)  Labs Review Labs Reviewed - No data to display  Imaging Review No results found.   Visual Acuity Review  Right Eye  Distance:   Left Eye Distance:   Bilateral Distance:    Right Eye Near:   Left Eye Near:    Bilateral Near:         MDM   1. Tension-type headache, not intractable, unspecified chronicity pattern   2. Polyuria   3. Polydipsia    Patient with frontal headache and malaise, also polyuria and polydipsia.  UA and CBG in [facility] today negative for hyperglycemia or glucosuria.  UA dipstick negative for findings of infection.   Suspect element of viral  Gastroenteritis underlying malaise; no findings of respiratory etiology.  Clear liquids; rest; tylenol as needed for malaise and headache. Follow up with primary care physician.     Barbaraann BarthelJames O Gable Odonohue, MD 05/09/15 1630  Barbaraann BarthelJames O Khyler Eschmann, MD 05/09/15 [phone]

## 2015-05-09 NOTE — Discharge Instructions (Signed)
It is a pleasure to see you today.   The labs and urinalysis are normal today.    I believe you may have a viral gastroenteritis (stomach bug).  I do not believe you have influenza ("the flu")>   Clear liquids; Tylenol as needed for headaches and body aches.  Follow up with Dr Caryl NeverBurchette in the coming few days if not improving.   Copious hand washing in order to prevent spread to others.

## 2015-05-10 ENCOUNTER — Encounter (HOSPITAL_COMMUNITY): Payer: Self-pay | Admitting: Emergency Medicine

## 2015-05-10 ENCOUNTER — Emergency Department (HOSPITAL_COMMUNITY): Payer: Medicaid Other

## 2015-05-10 DIAGNOSIS — R0789 Other chest pain: Secondary | ICD-10-CM | POA: Insufficient documentation

## 2015-05-10 DIAGNOSIS — R079 Chest pain, unspecified: Secondary | ICD-10-CM | POA: Diagnosis present

## 2015-05-10 LAB — BASIC METABOLIC PANEL
Anion gap: 7 (ref 5–15)
BUN: 13 mg/dL (ref 6–20)
CO2: 25 mmol/L (ref 22–32)
CREATININE: 1.18 mg/dL (ref 0.61–1.24)
Calcium: 9 mg/dL (ref 8.9–10.3)
Chloride: 108 mmol/L (ref 101–111)
GFR calc Af Amer: >60 mL/min (ref >60)
GLUCOSE: 114 mg/dL — AB (ref 65–99)
POTASSIUM: 4.4 mmol/L (ref 3.5–5.1)
SODIUM: 140 mmol/L (ref 135–145)

## 2015-05-10 LAB — CBC
HEMATOCRIT: 41.1 % (ref 39.0–52.0)
HEMOGLOBIN: 12.7 g/dL — AB (ref 13.0–17.0)
MCH: 30.5 pg (ref 26.0–34.0)
MCHC: 30.9 g/dL (ref 30.0–36.0)
MCV: 98.8 fL (ref 78.0–100.0)
Platelets: 179 10*3/uL (ref 150–400)
RBC: 4.16 MIL/uL — ABNORMAL LOW (ref 4.22–5.81)
RDW: 11.9 % (ref 11.5–15.5)
WBC: 5.9 10*3/uL (ref 4.0–10.5)

## 2015-05-10 LAB — I-STAT TROPONIN, ED: Troponin i, poc: 0 ng/mL (ref 0.00–0.08)

## 2015-05-10 NOTE — ED Notes (Signed)
Pt. reports right chest pain onset this evening , denies SOB , no nausea or diaphoresis .

## 2015-05-11 ENCOUNTER — Emergency Department (HOSPITAL_COMMUNITY)
Admission: EM | Admit: 2015-05-11 | Discharge: 2015-05-11 | Disposition: A | Discharge status: Home / Self Care | Payer: Medicaid Other | Attending: Emergency Medicine | Admitting: Emergency Medicine

## 2015-05-11 DIAGNOSIS — R079 Chest pain, unspecified: Secondary | ICD-10-CM

## 2015-05-11 MED ORDER — IBUPROFEN 800 MG PO TABS: 800.0000 mg | ORAL_TABLET | Freq: Three times a day (TID) | ORAL | Status: AC | PRN

## 2015-05-11 NOTE — Discharge Instructions (Signed)

## 2015-05-11 NOTE — ED Provider Notes (Signed)
By signing my name below, I, Freida Busman, attest that this documentation has been prepared under the direction and in the presence of Maxton Noreen N Tomeika Weinmann, DO . Electronically Signed: Freida Busman, Scribe. 05/11/2015. 3:49 AM.  TIME SEEN: 3:42 AM  CHIEF COMPLAINT:  Chief Complaint  Patient presents with  . Chest Pain   HPI:   Nicolas Arias is a 35 y.o. male with no significant past medical history who presents to the Emergency Department complaining of right sided CP pain, onset ~ 2100 last night. He states his pain was a 10/10 at onset but has improved since; notes he took ASA PTA. His pain is exacerbated with movement. He denies recent injury and  increased exertion. He denies SOB, nausea, vomiting, fever, cough, dizziness, and diaphoresis. He also denies h/o DVT/PE, LE swelling, recent surgery/hospitalization/extremity fracture, and long periods of immobilization.  ROS: See HPI Constitutional: no fever  Eyes: no drainage  ENT: no runny nose   Cardiovascular: chest pain  Resp: no SOB  GI: no vomiting GU: no dysuria Integumentary: no rash  Allergy: no hives  Musculoskeletal: no leg swelling  Neurological: no slurred speech ROS otherwise negative  PAST MEDICAL HISTORY/PAST SURGICAL HISTORY:  Past Medical History  Diagnosis Date  . Chest pain     MEDICATIONS:  Prior to Admission medications   Not on File    ALLERGIES:  No Known Allergies  SOCIAL HISTORY:  Social History  Substance Use Topics  . Smoking status: Never Smoker   . Smokeless tobacco: Not on file  . Alcohol Use: No    FAMILY HISTORY: Family History  Problem Relation Age of Onset  . Arrhythmia Father   . Heart disease Father     pacemaker, MI  . Hypertension      parent     EXAM: BP 106/72 mmHg  Pulse 80  Temp(Src) 98.1 F (36.7 C) (Oral)  Resp 20  Ht 6' (1.829 m)  Wt 159 lb (72.122 kg)  BMI 21.56 kg/m2  SpO2 98% CONSTITUTIONAL: Alert and oriented and responds appropriately to questions.  Well-appearing; well-nourished HEAD: Normocephalic EYES: Conjunctivae clear, PERRL ENT: normal nose; no rhinorrhea; moist mucous membranes NECK: Supple, no meningismus, no LAD  CARD: RRR; S1 and S2 appreciated; no murmurs, no clicks, no rubs, no gallops CHEST: TTP over right chest wall without crepitus, ecchymosis, deformity or other lesions  RESP: Normal chest excursion without splinting or tachypnea; breath sounds clear and equal bilaterally; no wheezes, no rhonchi, no rales, no hypoxia or respiratory distress, speaking full sentences ABD/GI: Normal bowel sounds; non-distended; soft, non-tender, no rebound, no guarding, no peritoneal signs BACK:  The back appears normal and is non-tender to palpation, there is no CVA tenderness EXT: Normal ROM in all joints; non-tender to palpation; no edema; normal capillary refill; no cyanosis, no calf tenderness or swelling    SKIN: Normal color for age and race; warm; no rash NEURO: Moves all extremities equally, sensation to light touch intact diffusely, cranial nerves II through XII intact PSYCH: The patient's mood and manner are appropriate. Grooming and personal hygiene are appropriate.  MEDICAL DECISION MAKING: Patient here with chest wall pain. He has no risk factors for pulmonary embolus. Doubt dissection. Doubt ACS. Labs ordered in triage including troponin are negative. Chest x-ray clear with no infiltrate, edema or pneumothorax. EKG shows early repolarization and is consistent with multiple old prior EKGs. I feel he is safe to be discharged. I have offered him pain medication which he declines stating his  pain is already better. We'll discharge with ibuprofen. Discussed return precautions.   At this time, I do not feel there is any life-threatening condition present. I have reviewed and discussed all results (EKG, imaging, lab, urine as appropriate), exam findings with patient. I have reviewed nursing notes and appropriate previous records.  I feel  the patient is safe to be discharged home without further emergent workup. Discussed usual and customary return precautions. Patient and family (if present) verbalize understanding and are comfortable with this plan.  Patient will follow-up with their primary care provider. If they do not have a primary care provider, information for follow-up has been provided to them. All questions have been answered.   EKG Interpretation  Date/Time:  Wednesday May 10 2015 23:04:01 EDT Ventricular Rate:  83 PR Interval:  164 QRS Duration: 90 QT Interval:  326 QTC Calculation: 383 R Axis:   102 Text Interpretation:  Normal sinus rhythm Pulmonary disease pattern Possible Right ventricular hypertrophy ST elevation, consider early repolarization, pericarditis, or injury Abnormal ECG No significant change since last tracing Confirmed by Garritt Molyneux,  DO, Missi Mcmackin [phone]([id number]) on 05/11/2015 3:40:26 AM       I personally performed the services described in this documentation, which was scribed in my presence. The recorded information has been reviewed and is accurate.    Layla MawKristen N Breshae Belcher, DO 05/11/15 [phone]

## 2016-09-13 ENCOUNTER — Ambulatory Visit (INDEPENDENT_AMBULATORY_CARE_PROVIDER_SITE_OTHER): Payer: BLUE CROSS/BLUE SHIELD | Admitting: Family Medicine

## 2016-09-13 ENCOUNTER — Encounter: Payer: Self-pay | Admitting: Family Medicine

## 2016-09-13 VITALS — BP 110/70 | HR 74 | Temp 98.2°F | Wt 167.9 lb

## 2016-09-13 DIAGNOSIS — L290 Pruritus ani: Secondary | ICD-10-CM | POA: Diagnosis not present

## 2016-09-13 DIAGNOSIS — H6121 Impacted cerumen, right ear: Secondary | ICD-10-CM | POA: Diagnosis not present

## 2016-09-13 NOTE — Patient Instructions (Signed)
Leave the Neosporin off anal area. Try over the counter hydrocortisone cream.

## 2016-09-13 NOTE — Progress Notes (Signed)
Subjective:     Patient ID: Nicolas Arias, male   DOB: 10/26/1980, 36 y.o.   MRN: [id number]  HPI Patient seen with right ear pain for about a month. He's had some fullness in the right ear. No drainage. He states he had tympanostomy tubes in childhood. No nasal congestion. No dizziness.  Second issue is some mild perirectal itching over the past couple weeks. No diarrhea. He tried some Neosporin without relief.  Past Medical History:  Diagnosis Date  . Chest pain    Past Surgical History:  Procedure Laterality Date  . TONSILECTOMY, ADENOIDECTOMY, BILATERAL MYRINGOTOMY AND TUBES      reports that he has never smoked. He has never used smokeless tobacco. He reports that he does not drink alcohol or use drugs. family history includes Arrhythmia in his father; Heart disease in his father; Hypertension in his unknown relative. No Known Allergies   Review of Systems  Constitutional: Negative for chills and fever.  HENT: Positive for ear pain. Negative for congestion and ear discharge.        Objective:   Physical Exam  Constitutional: He appears well-developed and well-nourished.  HENT:  Patient has cerumen impaction right canal. Left canal is clear. Impaction removed with curette and eardrum appears normal.  Cardiovascular: Normal rate and regular rhythm.   Pulmonary/Chest: Effort normal and breath sounds normal. No respiratory distress. He has no wheezes. He has no rales.  Genitourinary:  Genitourinary Comments: No visible hemorrhoids. No perianal rash noted.       Assessment:     #1 cerumen impaction right ear canal removed with curette  #2 complaints of. perianal pruritus with no evidence for rash on exam    Plan:     -Leave off Neosporin which could further irritate skin -Try over-the-counter hydrocortisone cream for itching -Clean area thoroughly after stools  Nicolas CoveyBruce W Lacara Dunsworth MD [facility]

## 2016-10-22 ENCOUNTER — Encounter: Payer: BLUE CROSS/BLUE SHIELD | Admitting: Family Medicine

## 2016-10-22 DIAGNOSIS — Z0289 Encounter for other administrative examinations: Secondary | ICD-10-CM

## 2016-10-31 ENCOUNTER — Encounter: Payer: Self-pay | Admitting: Family Medicine

## 2020-03-17 ENCOUNTER — Ambulatory Visit (HOSPITAL_COMMUNITY)
Admission: EM | Admit: 2020-03-17 | Discharge: 2020-03-17 | Disposition: A | Discharge status: Home / Self Care | Payer: BC Managed Care – PPO | Attending: Medical Oncology | Admitting: Medical Oncology

## 2020-03-17 ENCOUNTER — Encounter (HOSPITAL_COMMUNITY): Payer: Self-pay

## 2020-03-17 ENCOUNTER — Other Ambulatory Visit: Payer: Self-pay

## 2020-03-17 DIAGNOSIS — J029 Acute pharyngitis, unspecified: Secondary | ICD-10-CM | POA: Diagnosis present

## 2020-03-17 DIAGNOSIS — R0981 Nasal congestion: Secondary | ICD-10-CM | POA: Insufficient documentation

## 2020-03-17 DIAGNOSIS — R059 Cough, unspecified: Secondary | ICD-10-CM | POA: Diagnosis not present

## 2020-03-17 DIAGNOSIS — U071 COVID-19: Secondary | ICD-10-CM | POA: Insufficient documentation

## 2020-03-17 DIAGNOSIS — J069 Acute upper respiratory infection, unspecified: Secondary | ICD-10-CM | POA: Diagnosis not present

## 2020-03-17 LAB — SARS CORONAVIRUS 2 (TAT 6-24 HRS): SARS Coronavirus 2: POSITIVE — AB

## 2020-03-17 NOTE — ED Triage Notes (Signed)
Pt presents with body aches, fatigue, nasal congestion, chills and sore throat x 3 days. States he had 2 negative home test for COVID

## 2020-03-17 NOTE — ED Provider Notes (Signed)
[facility]    CSN: [id number] Arrival date & time: 03/17/20  1009      History   Chief Complaint Chief Complaint  Patient presents with  . Generalized Body Aches  . Fatigue  . Nasal Congestion  . Sore Throat  . Chills    HPI Nicolas Arias is a 40 y.o. male.   HPI   Cold Symptoms: Pt reports that for the past 3 days he has had body aches, fatigue, nasal congestion, sore throat and chills. He reports 2 negative home tests. He has tried to rest and drink orange juice for symptoms. His sore throat and initial chest tightness has resolved. No loss of taste or smell, nausea, diarrhea, vomiting, chest pain or current shortness of breath. He had had both COVID-19 vaccines in his series but is overdue for his booster.   Past Medical History:  Diagnosis Date  . Chest pain     Patient Active Problem List   Diagnosis Date Noted  . Chest pain     Past Surgical History:  Procedure Laterality Date  . TONSILECTOMY, ADENOIDECTOMY, BILATERAL MYRINGOTOMY AND TUBES        Home Medications    Prior to Admission medications   Medication Sig Start Date End Date Taking? Authorizing Provider  Ascorbic Acid (VITAMIN C) 1000 MG tablet Take 1,000 mg by mouth daily.   Yes [provider]  dextromethorphan-guaiFENesin (MUCINEX DM) 30-600 MG 12hr tablet Take 1 tablet by mouth 2 (two) times daily.   Yes [provider]  pseudoephedrine-acetaminophen (TYLENOL SINUS) 30-500 MG TABS tablet Take 1 tablet by mouth every 4 (four) hours as needed.   Yes [provider]  ibuprofen (ADVIL,MOTRIN) 800 MG tablet Take 1 tablet (800 mg total) by mouth every 8 (eight) hours as needed for mild pain. 05/11/15   Ward, Layla Maw, DO    Family History Family History  Problem Relation Age of Onset  . Arrhythmia Father   . Heart disease Father        pacemaker, MI  . Hypertension Other        parent     Social History Social History   Tobacco Use  . Smoking  status: Never Smoker  . Smokeless tobacco: Never Used  Substance Use Topics  . Alcohol use: No  . Drug use: No     Allergies   Patient has no known allergies.   Review of Systems Review of Systems   Physical Exam Triage Vital Signs ED Triage Vitals  Enc Vitals Group     BP 03/17/20 1126 (!) 97/56     Pulse Rate 03/17/20 1126 (!) 114     Resp 03/17/20 1126 (!) 21     Temp 03/17/20 1126 98.8 F (37.1 C)     Temp Source 03/17/20 1126 Oral     SpO2 03/17/20 1126 100 %     Weight --      Height --      Head Circumference --      Peak Flow --      Pain Score 03/17/20 1127 0     Pain Loc --      Pain Edu? --      Excl. in GC? --    No data found.  Updated Vital Signs BP 109/62 (BP Location: Right Arm)   Pulse (!) 111   Temp 98.8 F (37.1 C) (Oral)   Resp 16   SpO2 100%   Physical Exam Vitals and nursing  note reviewed.  Constitutional:      General: He is not in acute distress.    Appearance: He is well-developed. He is not ill-appearing, toxic-appearing or diaphoretic.  HENT:     Head: Normocephalic.     Right Ear: Tympanic membrane normal. No drainage, swelling or tenderness. No middle ear effusion. Tympanic membrane is not erythematous.     Left Ear: Tympanic membrane normal. No drainage, swelling or tenderness.  No middle ear effusion. Tympanic membrane is not erythematous.     Nose: No congestion or rhinorrhea.     Mouth/Throat:     Mouth: Mucous membranes are moist. No oral lesions.     Pharynx: No pharyngeal swelling or oropharyngeal exudate. Posterior oropharyngeal erythema: mild without petichiea      Tonsils: No tonsillar exudate or tonsillar abscesses.  Cardiovascular:     Rate and Rhythm: Normal rate and regular rhythm.     Heart sounds: Normal heart sounds. No murmur heard. No friction rub. No gallop.   Pulmonary:     Effort: Pulmonary effort is normal. No respiratory distress.     Breath sounds: Normal breath sounds. No stridor. No wheezing,  rhonchi or rales.  Chest:     Chest wall: No tenderness.  Abdominal:     Palpations: Abdomen is soft.  Musculoskeletal:     Cervical back: Neck supple.  Lymphadenopathy:     Cervical: No cervical adenopathy.  Skin:    General: Skin is warm and dry.  Neurological:     Mental Status: He is alert.      UC Treatments / Results  Labs (all labs ordered are listed, but only abnormal results are displayed) Labs Reviewed  SARS CORONAVIRUS 2 (TAT 6-24 HRS)    EKG   Radiology No results found.  Procedures Procedures (including critical care time)  Medications Ordered in UC Medications - No data to display  Initial Impression / Assessment and Plan / UC Course  I have reviewed the triage vital signs and the nursing notes.  Pertinent labs & imaging results that were available during my care of the patient were reviewed by me and considered in my medical decision making (see chart for details).     New. Official COVID-19 test pending.Physical exam is reassuring. Vitals to be repeated.  May be secondary to lack of significant oral hydration today given his O2 is 100%, he denies SOB/chest pain and he is afebrile.   Update: Repeated vitals are much improved. Sending home with COVID-19 precautions and red flag signs and symptoms.   Final Clinical Impressions(s) / UC Diagnoses   Final diagnoses:  URI with cough and congestion   Discharge Instructions   None    ED Prescriptions    None     PDMP not reviewed this encounter.   Rushie Chestnut, [place] 03/17/20 1213

## 2021-04-09 ENCOUNTER — Emergency Department (HOSPITAL_COMMUNITY): Payer: Self-pay

## 2021-04-09 ENCOUNTER — Encounter (HOSPITAL_COMMUNITY): Payer: Self-pay

## 2021-04-09 ENCOUNTER — Emergency Department (HOSPITAL_COMMUNITY)
Admission: EM | Admit: 2021-04-09 | Discharge: 2021-04-10 | Discharge status: Against Medical Advice | Payer: Self-pay | Attending: Emergency Medicine | Admitting: Emergency Medicine

## 2021-04-09 DIAGNOSIS — U071 COVID-19: Secondary | ICD-10-CM | POA: Insufficient documentation

## 2021-04-09 DIAGNOSIS — Z5321 Procedure and treatment not carried out due to patient leaving prior to being seen by health care provider: Secondary | ICD-10-CM | POA: Insufficient documentation

## 2021-04-09 LAB — CBC WITH DIFFERENTIAL/PLATELET
Abs Immature Granulocytes: 0 10*3/uL (ref 0.00–0.07)
Basophils Absolute: 0 10*3/uL (ref 0.0–0.1)
Basophils Relative: 0 %
Eosinophils Absolute: 0 10*3/uL (ref 0.0–0.5)
Eosinophils Relative: 1 %
HCT: 42 % (ref 39.0–52.0)
Hemoglobin: 13 g/dL (ref 13.0–17.0)
Immature Granulocytes: 0 %
Lymphocytes Relative: 49 %
Lymphs Abs: 2.3 10*3/uL (ref 0.7–4.0)
MCH: 30.7 pg (ref 26.0–34.0)
MCHC: 31 g/dL (ref 30.0–36.0)
MCV: 99.3 fL (ref 80.0–100.0)
Monocytes Absolute: 0.5 10*3/uL (ref 0.1–1.0)
Monocytes Relative: 11 %
Neutro Abs: 1.8 10*3/uL (ref 1.7–7.7)
Neutrophils Relative %: 39 %
Platelets: 161 10*3/uL (ref 150–400)
RBC: 4.23 MIL/uL (ref 4.22–5.81)
RDW: 11.9 % (ref 11.5–15.5)
WBC: 4.7 10*3/uL (ref 4.0–10.5)
nRBC: 0 % (ref 0.0–0.2)

## 2021-04-09 LAB — COMPREHENSIVE METABOLIC PANEL
ALT: 13 U/L (ref 0–44)
AST: 19 U/L (ref 15–41)
Albumin: 4.1 g/dL (ref 3.5–5.0)
Alkaline Phosphatase: 57 U/L (ref 38–126)
Anion gap: 8 (ref 5–15)
BUN: 11 mg/dL (ref 6–20)
CO2: 26 mmol/L (ref 22–32)
Calcium: 8.8 mg/dL — ABNORMAL LOW (ref 8.9–10.3)
Chloride: 103 mmol/L (ref 98–111)
Creatinine, Ser: 1.17 mg/dL (ref 0.61–1.24)
GFR, Estimated: >60 mL/min (ref >60)
Glucose, Bld: 105 mg/dL — ABNORMAL HIGH (ref 70–99)
Potassium: 3.7 mmol/L (ref 3.5–5.1)
Sodium: 137 mmol/L (ref 135–145)
Total Bilirubin: 0.5 mg/dL (ref 0.3–1.2)
Total Protein: 6.8 g/dL (ref 6.5–8.1)

## 2021-04-09 LAB — RESP PANEL BY RT-PCR (FLU A&B, COVID) ARPGX2
Influenza A by PCR: NEGATIVE
Influenza B by PCR: NEGATIVE
SARS Coronavirus 2 by RT PCR: POSITIVE — AB

## 2021-04-09 LAB — D-DIMER, QUANTITATIVE: D-Dimer, Quant: 0.57 ug/mL-FEU — ABNORMAL HIGH (ref 0.00–0.50)

## 2021-04-09 LAB — TROPONIN I (HIGH SENSITIVITY): Troponin I (High Sensitivity): <2 ng/L (ref <18)

## 2021-04-09 NOTE — ED Provider Triage Note (Addendum)
Emergency Medicine Provider Triage Evaluation Note  Nicolas Arias , a 41 y.o. male  was evaluated in triage.  Pt complains of chest pain and SOB.  Tested + for covid this morning, started having symptoms this morning. Pain is right sided, dull with sharp/stabbing sensations intermittently.  No cardiac hx.  No hx DVT/PE.  Denies cough.  Review of Systems  Positive: Chest pain, SOB Negative: fever  Physical Exam  BP 92/72 (BP Location: Right Arm)    Pulse 89    Temp 98.7 F (37.1 C) (Oral)    Resp 18    SpO2 99%   Gen:   Awake, no distress   Resp:  Normal effort  MSK:   Moves extremities without difficulty  Other:    Medical Decision Making  Medically screening exam initiated at 10:35 PM.  Appropriate orders placed.  LESHAWN STRAKA was informed that the remainder of the evaluation will be completed by another provider, this initial triage assessment does not replace that evaluation, and the importance of remaining in the ED until their evaluation is complete.  Chest pain.  Covid + as of this morning.  EKG, labs w/d-dimer, CXR.  12:25 AM D-dimer +, will get CTA.  CXR clear.   Garlon Hatchet, PA-C 04/09/21 2239    Garlon Hatchet, PA-C 04/10/21 0025

## 2021-04-09 NOTE — ED Triage Notes (Signed)
Pt states that he began to have covid symptoms on Friday, tested + this morning and is now having CP and SOB

## 2021-04-10 ENCOUNTER — Emergency Department (HOSPITAL_COMMUNITY): Payer: Self-pay

## 2021-04-10 LAB — TROPONIN I (HIGH SENSITIVITY): Troponin I (High Sensitivity): <2 ng/L (ref <18)

## 2021-04-10 MED ORDER — IOHEXOL 350 MG/ML SOLN
57.0000 mL | Freq: Once | INTRAVENOUS | Status: AC | PRN
  Administered 2021-04-10: 57 mL via INTRAVENOUS

## 2021-04-10 NOTE — ED Notes (Signed)
Patient sitting in appropriate seating area 

## 2021-04-10 NOTE — ED Notes (Signed)
Patient states he will call medical records in the morning but he has to leave now

## 2021-06-25 ENCOUNTER — Ambulatory Visit: Payer: BC Managed Care – PPO | Admitting: Podiatry

## 2023-07-23 ENCOUNTER — Ambulatory Visit (INDEPENDENT_AMBULATORY_CARE_PROVIDER_SITE_OTHER): Admitting: Podiatry

## 2023-07-23 ENCOUNTER — Encounter: Payer: Self-pay | Admitting: Podiatry

## 2023-07-23 ENCOUNTER — Ambulatory Visit (INDEPENDENT_AMBULATORY_CARE_PROVIDER_SITE_OTHER)

## 2023-07-23 VITALS — Ht 72.0 in | Wt 170.0 lb

## 2023-07-23 DIAGNOSIS — M2141 Flat foot [pes planus] (acquired), right foot: Secondary | ICD-10-CM | POA: Diagnosis not present

## 2023-07-23 DIAGNOSIS — M2142 Flat foot [pes planus] (acquired), left foot: Secondary | ICD-10-CM

## 2023-07-23 DIAGNOSIS — M722 Plantar fascial fibromatosis: Secondary | ICD-10-CM

## 2023-07-23 MED ORDER — TRIAMCINOLONE ACETONIDE 10 MG/ML IJ SUSP
10.0000 mg | Freq: Once | INTRAMUSCULAR | Status: AC
  Administered 2023-07-23: 10 mg via INTRA_ARTICULAR

## 2023-07-23 NOTE — Progress Notes (Signed)
 Subjective:   Patient ID: Nicolas Arias, male   DOB: 43 y.o.   MRN: [id number]   HPI Patient presents stating that he has a lot of pain in his arch of both feet and he is flat-footed and he gets lower back pain at times.  But states the arch is what is bothering him the most currently.  Patient does not smoke tries to be active has a relative sedentary job   Review of Systems  All other systems reviewed and are negative.       Objective:  Physical Exam Vitals and nursing note reviewed.  Constitutional:      Appearance: He is well-developed.  Pulmonary:     Effort: Pulmonary effort is normal.  Musculoskeletal:        General: Normal range of motion.  Skin:    General: Skin is warm.  Neurological:     Mental Status: He is alert.     Neurovascular status intact muscle strength out of the adequate range of motion within normal limits with exquisite discomfort in the mid arch area bilateral fluid buildup around the medial fascial band as it is going through the arch with significant flatfoot deformity no current indication of coalition or arthritis.  Good digital perfusion well-oriented signal     Assessment:  Thickened flatfoot with acute plantar fasciitis mid arch bilateral     Plan:  H&P reviewed sterile prep injected the mid arch fascial band bilateral 3 mg Kenalog 5 mg Xylocaine and applied sterile dressing.  I then went ahead and I discussed night splints he will buy those himself and use them along with aggressive ice therapy ibuprofen  as needed.  He has over-the-counter insole which I think is adequate at this point may require custom at 1 point in future  X-rays indicate significant flatfoot deformity bilateral no other pathology noted

## 2023-11-16 IMAGING — CT CT ANGIO CHEST
2 of 6 series · 19 of 46 positions shown · IV contrast (agent unspecified)
Comparison: Chest radiograph dated 04/09/2021.

CLINICAL DATA: Concern for pulmonary edema.

EXAM:
CT ANGIOGRAPHY CHEST WITH CONTRAST
TECHNIQUE: Multidetector CT imaging of the chest was performed using the
standard protocol during bolus administration of intravenous
contrast. Multiplanar CT image reconstructions and MIPs were
obtained to evaluate the vascular anatomy.

[Series 7: thins · axial · 0.66mm/px · z∈[+705,+972]mm · 16 of 293 slices shown]
[im 13/293  lung]
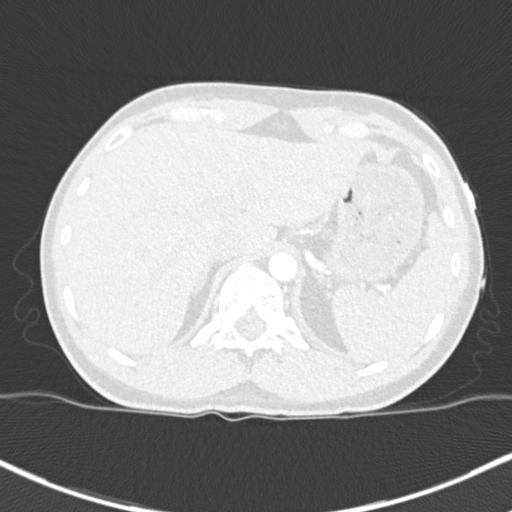
[im 39/293  soft-tissue]
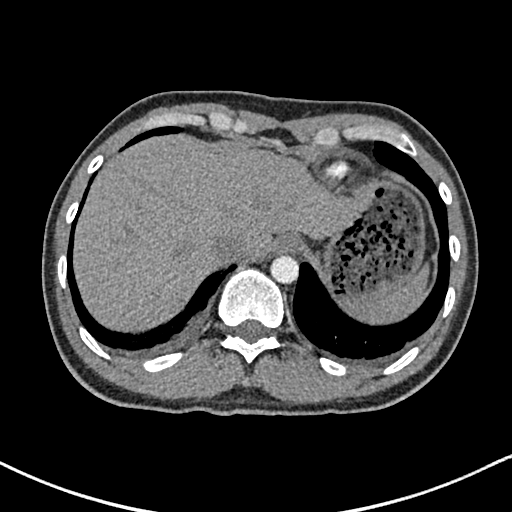
[im 51/293  lung]
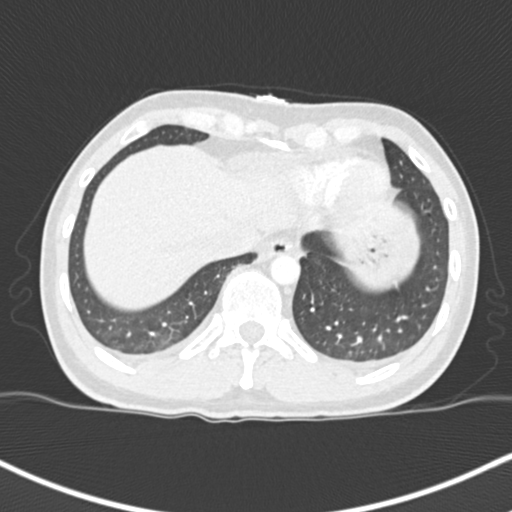
[im 64/293  soft-tissue]
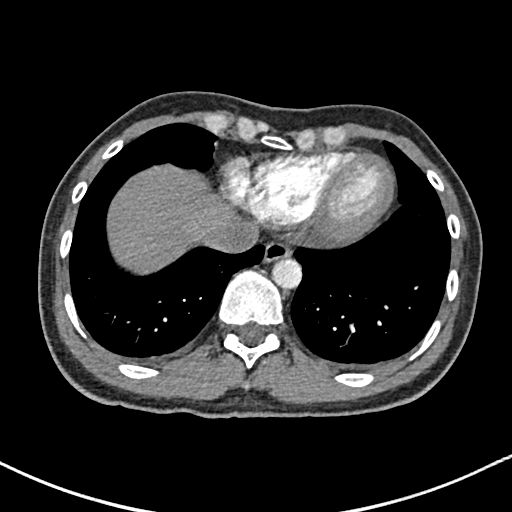
[im 89/293  lung]
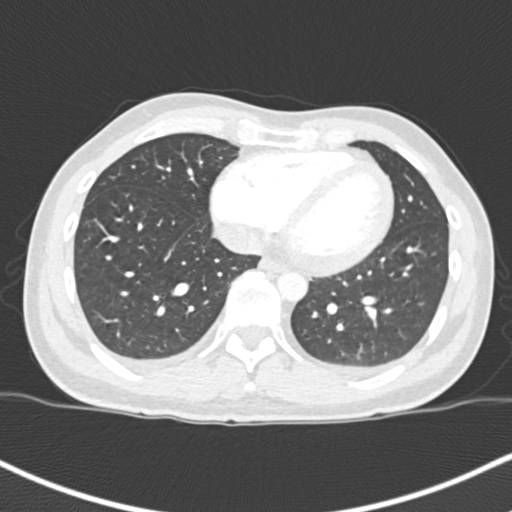
[im 102/293  soft-tissue]
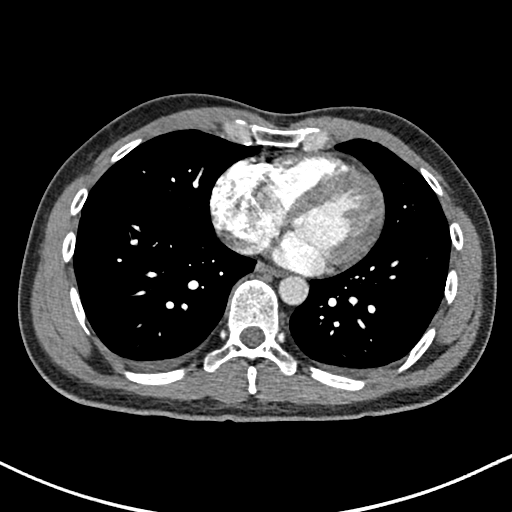
[im 115/293  lung]
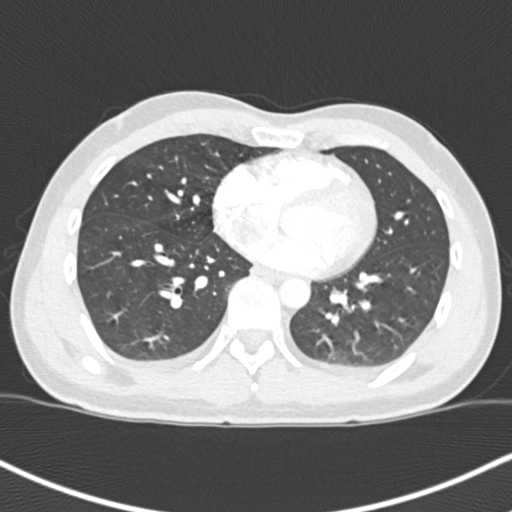
[im 140/293  soft-tissue]
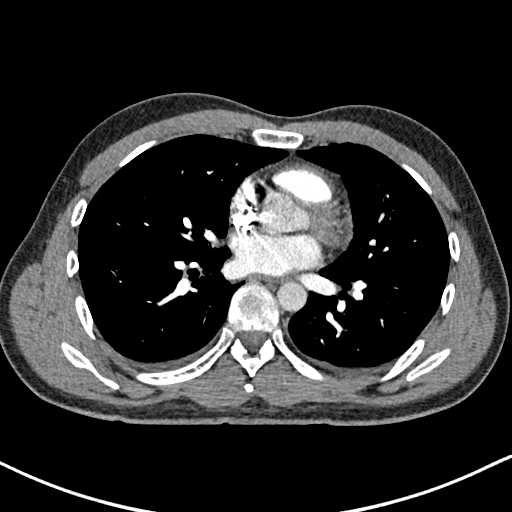
[im 153/293  lung]
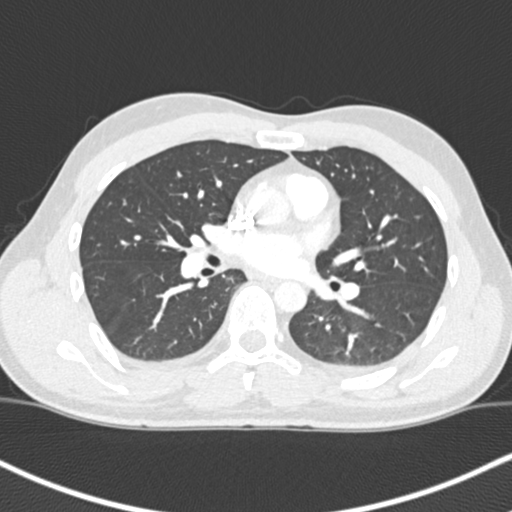
[im 178/293  soft-tissue]
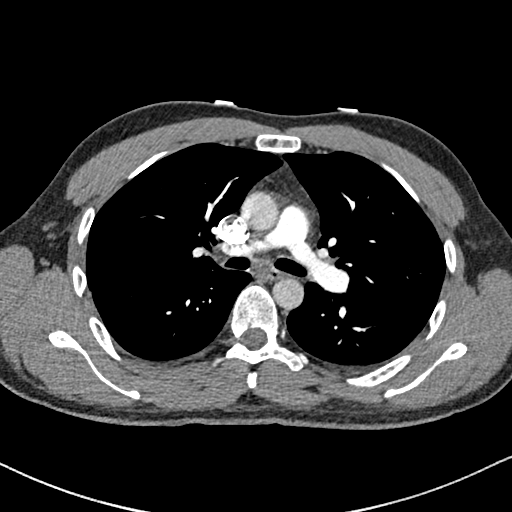
[im 191/293  lung]
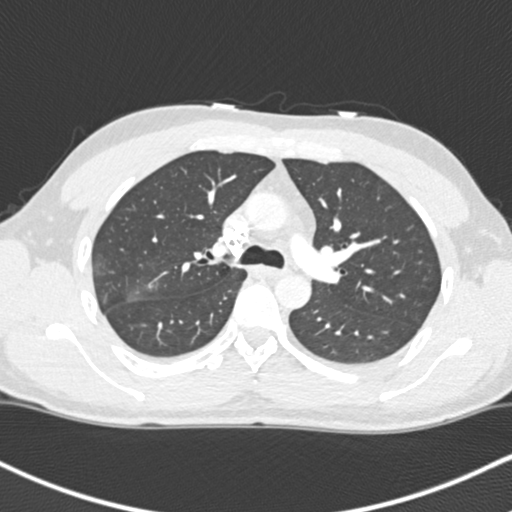
[im 204/293  soft-tissue]
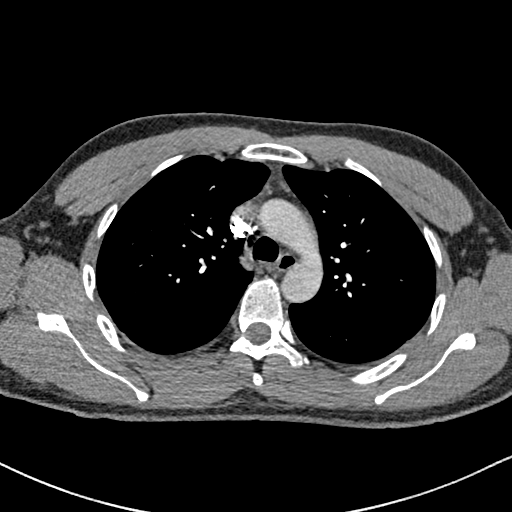
[im 229/293  lung]
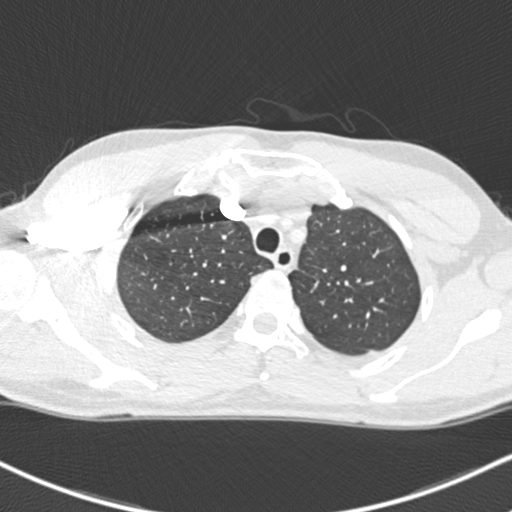
[im 242/293  soft-tissue]
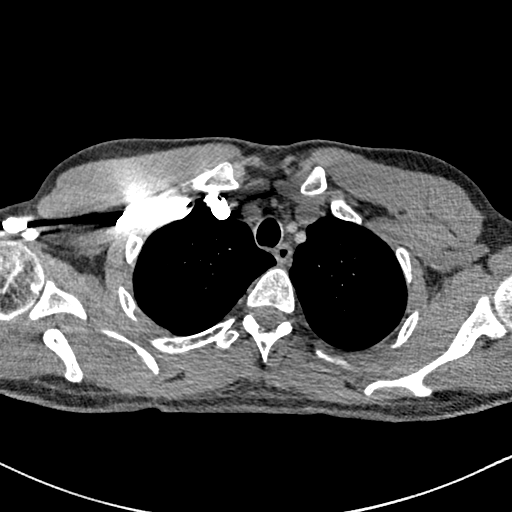
[im 254/293  lung]
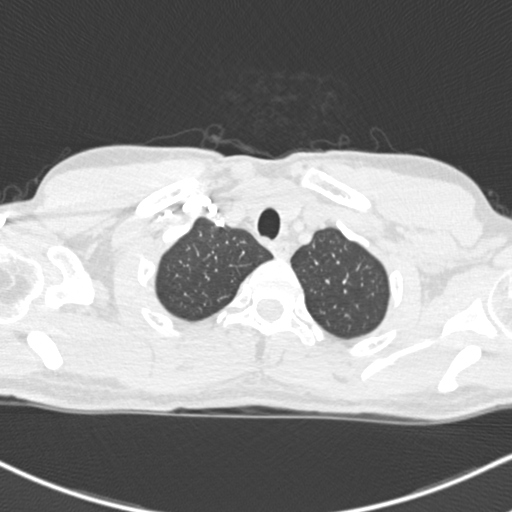
[im 280/293  soft-tissue]
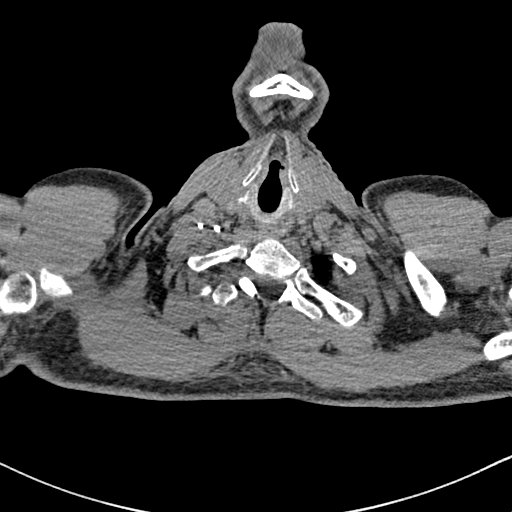

[Series 8: coronal mpr · coronal · 0.59mm/px · 3 of 107 slices shown]
[im 27/107  soft-tissue]
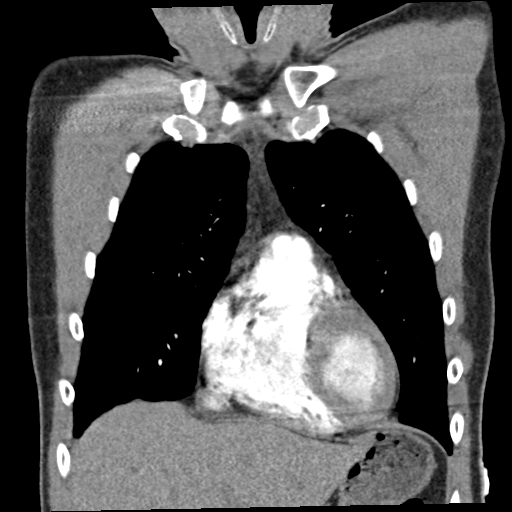
[im 54/107  soft-tissue]
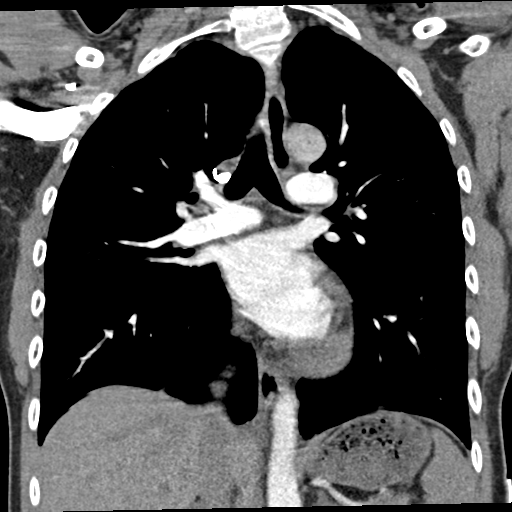
[im 80/107  soft-tissue]
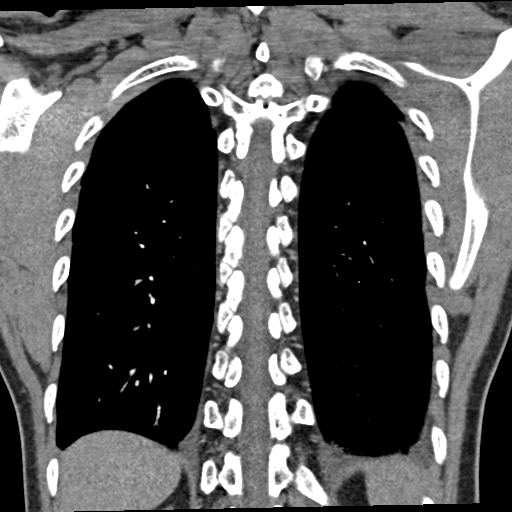

[19 of 46 positions shown; findings below may reference images not displayed]

RADIATION DOSE REDUCTION: This exam was performed according to the
departmental dose-optimization program which includes automated
exposure control, adjustment of the mA and/or kV according to
patient size and/or use of iterative reconstruction technique.

CONTRAST:  57mL OMNIPAQUE IOHEXOL 350 MG/ML SOLN
FINDINGS: Cardiovascular: There is no cardiomegaly or pericardial effusion.
The thoracic aorta is unremarkable. No pulmonary artery embolus
identified.

Mediastinum/Nodes: No hilar or mediastinal adenopathy. The esophagus
is grossly unremarkable. No mediastinal fluid collection.

Lungs/Pleura: Trace bilateral pleural effusions. Faint right upper
lobe subpleural density may represent atelectasis or atypical
infection such as [id number]. No focal consolidation or pneumothorax.
The central airways are patent.

Upper Abdomen: No acute abnormality.

Musculoskeletal: No chest wall abnormality. No acute or significant
osseous findings.

Review of the MIP images confirms the above findings.
IMPRESSION: 1. No CT evidence of pulmonary artery embolus.
2. Faint right upper lobe subpleural densities suspicious for
atypical infection including [id number]. Clinical correlation is
recommended. No focal consolidation.
3. Trace bilateral pleural effusions.
